# Patient Record
Sex: Female | Born: 1995 | Hispanic: No | Marital: Single | Smoking: Never smoker
Health system: Southern US, Community
[De-identification: ages and names within clinical notes are randomized; demographics above are authoritative.]

## PROBLEM LIST (undated history)

## (undated) DIAGNOSIS — K219 Gastro-esophageal reflux disease without esophagitis: Secondary | ICD-10-CM

## (undated) DIAGNOSIS — Z789 Other specified health status: Secondary | ICD-10-CM

---

## 2017-07-06 LAB — OB RESULTS CONSOLE HEPATITIS B SURFACE ANTIGEN: Hepatitis B Surface Ag: NEGATIVE

## 2017-07-06 LAB — OB RESULTS CONSOLE GC/CHLAMYDIA
Chlamydia: NEGATIVE
Gonorrhea: NEGATIVE

## 2017-07-06 LAB — OB RESULTS CONSOLE RUBELLA ANTIBODY, IGM: RUBELLA: IMMUNE

## 2017-07-06 LAB — OB RESULTS CONSOLE HIV ANTIBODY (ROUTINE TESTING): HIV: NONREACTIVE

## 2017-07-06 LAB — OB RESULTS CONSOLE VARICELLA ZOSTER ANTIBODY, IGG: Varicella: IMMUNE

## 2017-07-06 LAB — OB RESULTS CONSOLE RPR: RPR: NONREACTIVE

## 2017-09-14 ENCOUNTER — Other Ambulatory Visit: Payer: Self-pay | Admitting: Obstetrics and Gynecology

## 2017-09-14 DIAGNOSIS — Z3A24 24 weeks gestation of pregnancy: Secondary | ICD-10-CM

## 2017-09-14 DIAGNOSIS — Z3689 Encounter for other specified antenatal screening: Secondary | ICD-10-CM

## 2017-09-14 DIAGNOSIS — O283 Abnormal ultrasonic finding on antenatal screening of mother: Secondary | ICD-10-CM

## 2017-09-20 ENCOUNTER — Encounter (HOSPITAL_COMMUNITY): Payer: Self-pay | Admitting: *Deleted

## 2017-09-21 ENCOUNTER — Ambulatory Visit (HOSPITAL_COMMUNITY): Payer: Medicaid Other | Attending: Obstetrics & Gynecology

## 2017-09-21 ENCOUNTER — Ambulatory Visit (HOSPITAL_COMMUNITY): Admission: RE | Admit: 2017-09-21 | Payer: Medicaid Other | Source: Ambulatory Visit

## 2017-09-29 ENCOUNTER — Encounter (HOSPITAL_COMMUNITY): Payer: Self-pay

## 2017-10-03 ENCOUNTER — Other Ambulatory Visit: Payer: Self-pay | Admitting: Obstetrics and Gynecology

## 2017-10-03 ENCOUNTER — Ambulatory Visit (HOSPITAL_COMMUNITY)
Admission: RE | Admit: 2017-10-03 | Discharge: 2017-10-03 | Disposition: A | Discharge status: Home / Self Care | Payer: Medicaid Other | Source: Ambulatory Visit | Attending: Obstetrics and Gynecology | Admitting: Obstetrics and Gynecology

## 2017-10-03 ENCOUNTER — Encounter (HOSPITAL_COMMUNITY): Payer: Self-pay

## 2017-10-03 ENCOUNTER — Ambulatory Visit (HOSPITAL_COMMUNITY)
Admission: RE | Admit: 2017-10-03 | Discharge: 2017-10-03 | Disposition: A | Discharge status: Home / Self Care | Payer: Medicaid Other | Source: Ambulatory Visit | Attending: Nurse Practitioner | Admitting: Nurse Practitioner

## 2017-10-03 DIAGNOSIS — O359XX0 Maternal care for (suspected) fetal abnormality and damage, unspecified, not applicable or unspecified: Secondary | ICD-10-CM | POA: Insufficient documentation

## 2017-10-03 DIAGNOSIS — Z3A26 26 weeks gestation of pregnancy: Secondary | ICD-10-CM

## 2017-10-03 DIAGNOSIS — O283 Abnormal ultrasonic finding on antenatal screening of mother: Secondary | ICD-10-CM

## 2017-10-03 DIAGNOSIS — Z3689 Encounter for other specified antenatal screening: Secondary | ICD-10-CM | POA: Insufficient documentation

## 2017-10-03 DIAGNOSIS — Z6835 Body mass index (BMI) 35.0-35.9, adult: Secondary | ICD-10-CM | POA: Diagnosis not present

## 2017-10-03 DIAGNOSIS — E669 Obesity, unspecified: Secondary | ICD-10-CM | POA: Diagnosis not present

## 2017-10-03 DIAGNOSIS — O99212 Obesity complicating pregnancy, second trimester: Secondary | ICD-10-CM | POA: Insufficient documentation

## 2017-10-03 DIAGNOSIS — Z3A24 24 weeks gestation of pregnancy: Secondary | ICD-10-CM

## 2017-10-03 HISTORY — DX: Other specified health status: Z78.9

## 2017-10-05 DIAGNOSIS — O283 Abnormal ultrasonic finding on antenatal screening of mother: Secondary | ICD-10-CM | POA: Insufficient documentation

## 2017-10-05 DIAGNOSIS — Z3A26 26 weeks gestation of pregnancy: Secondary | ICD-10-CM | POA: Insufficient documentation

## 2017-10-05 NOTE — Progress Notes (Signed)
Genetic Counseling  High-Risk Gestation Note  Appointment Date:  10/03/2017 Referred By: Einar Gradyler, Janna, NP Date of Birth:  1996/07/01   Pregnancy History: G2P1001 Estimated Date of Delivery: 01/06/18 Estimated Gestational Age: 2556w3d Attending: Particia NearingMartha Decker, MD  Ms. Loma MessingJacqueline Bradway were seen for genetic counseling because of a soft marker for fetal aneuploidy on ultrasound. [employer] Spanish/English telephonic interpreter [phone] provided interpretation for today's visit.     In summary:  Reviewed EIF  Discussed age related risk for fetal aneuploidy  In the absence of additional risk factors, isolated EIF not expected to significantly increase the risk for Down syndrome in the pregnancy  Discussed significance of prior screening for fetal aneuploidy  Quad screen was attempted previously in pregnancy but was drawn too early  Currently too late in gestation for Quad screening  Offered additional screening  NIPS- declined  Discussed option of diagnostic testing  Amniocentesis- declined  Diane Carroll was seen for ultrasound today.  Ultrasound revealed an echogenic intracardiac focus (EIF). Remaining visualized fetal anatomy was within normal limits with no additional markers for aneuploidy.  The ultrasound report will be sent under separate cover.    We discussed that the second trimester genetic sonogram is targeted at identifying features associated with aneuploidy.  It has evolved as a screening tool used to provide an individualized risk assessment for Down syndrome and other trisomies.  The ability of sonography to aid in the detection of aneuploidies relies on identification of both major structural anomalies and "soft markers."  The patient was counseled that the latter term refers to findings that are often normal variants and do not cause any significant medical problems.  Nonetheless, these markers have a known association with aneuploidy.    The  patient was counseled that an EIF is characterized by calcified papillary muscle leading to a discreet dot in the left, or less commonly, the right ventricle.  We discussed that this finding is typically considered to be a benign variant; however, the risk of aneuploidy is increased when this marker is found in patients who have additional risk factors for fetal aneuploidy (AMA, abnormal screening test, or other markers or anomalies by fetal ultrasound).   We reviewed that Ms. Goldman's risk to have a fetus with Down syndrome is ~1 in 1000, based on her age of 21 years.  Given that no additional risk factors for fetal aneuploidy are present, an apparently isolated EIF would not be expected to significantly increase the risk for fetal aneuploidy.   We briefly reviewed chromosomes, nondisjunction, and the common features and variable prognosis of Down syndrome.  Ms. Mikle BosworthCarlos previously had Quad screening performed through her OB provider; however, ultrasound earlier in pregnancy [facility], thus, making the Quad screen drawn at too early a gestational age. She is currently too far advanced in gestation for repeat Quad screen.  We reviewed other available screening and diagnostic options including noninvasive prenatal screening (NIPS)/cell free DNA (cfDNA) screening and amniocentesis.  She was counseled regarding the benefits and limitations of each option.  We reviewed the approximate 1 in 300-500 risk for complications for amniocentesis.  After consideration of all the options, she declined additional screening or testing for fetal aneuploidy, including NIPS and amniocentesis.     Ms. Mikle BosworthCarlos was provided with written information regarding cystic fibrosis (CF), spinal muscular atrophy (SMA) and hemoglobinopathies including the carrier frequency, availability of carrier screening and prenatal diagnosis if indicated.  In addition, CF and hemoglobinopathies are routinely screened for as part  of the [place] newborn  screening panel, and SMA is available as part of newborn screening under a research protocol by enrolling in a program entitled Early Check.  She declined screening for CF, SMA and hemoglobinopathies.  The patient reported no known family history of birth defects, intellectual disability, and known genetic conditions for herself or the father of the pregnancy. Consanguinity was denied. Without further information regarding the provided family history, an accurate genetic risk cannot be calculated. Further genetic counseling is warranted if more information is obtained.  Ms. Mikle BosworthCarlos denied exposure to environmental toxins or chemical agents. She denied the use of alcohol, tobacco or street drugs. She denied significant viral illnesses during the course of her pregnancy.   I counseled Ms. Loma MessingJacqueline Edmonds regarding the above risks and available options.  The approximate face-to-face time with the genetic counselor was 20 minutes.     Quinn PlowmanKaren Aurelie Dicenzo, MS Certified Genetic Counselor 10/05/2017

## 2017-10-11 ENCOUNTER — Encounter (HOSPITAL_COMMUNITY): Payer: Self-pay

## 2017-10-11 ENCOUNTER — Other Ambulatory Visit (HOSPITAL_COMMUNITY): Payer: Self-pay

## 2017-11-27 ENCOUNTER — Inpatient Hospital Stay (HOSPITAL_COMMUNITY): Admission: AD | Admit: 2017-11-27 | Payer: Self-pay | Source: Ambulatory Visit | Admitting: Family Medicine

## 2017-11-28 NOTE — L&D Delivery Note (Signed)
Patient is 22 y.o. G2P1001 9952w5d admitted for IOl 2/2 polyhydraminos. S/p IOL with Pitocin. SROM at 0830.  Prenatal course also complicated by pyelonephritis .  Delivery Note At 1:18 PM a viable female was delivered via Vaginal, Spontaneous (Presentation: OA).  APGAR: 9, 9; weight pending.   Placenta status: Intact.  Cord: 3V with the following complications: None.  Cord pH: N/A  Anesthesia: Epidural   Episiotomy: None Lacerations: None Est. Blood Loss (mL): 350; Methergine IM x1 given for prophylaxis due to continued bleeding and polyhydramnios uterus.  Mom to postpartum.  Baby to Couplet care / Skin to Skin.  Caryl AdaJazma Hilde Churchman, DO OB Fellow [facility], [facility]

## 2017-12-01 ENCOUNTER — Encounter: Payer: Self-pay | Admitting: Obstetrics and Gynecology

## 2017-12-01 DIAGNOSIS — O34219 Maternal care for unspecified type scar from previous cesarean delivery: Secondary | ICD-10-CM | POA: Insufficient documentation

## 2017-12-01 DIAGNOSIS — Z789 Other specified health status: Secondary | ICD-10-CM | POA: Insufficient documentation

## 2017-12-15 LAB — OB RESULTS CONSOLE GBS: GBS: NEGATIVE

## 2017-12-31 ENCOUNTER — Inpatient Hospital Stay (HOSPITAL_COMMUNITY)
Admission: AD | Admit: 2017-12-31 | Discharge: 2018-01-06 | DRG: 806 | Disposition: A | Discharge status: Home / Self Care | Payer: Medicaid Other | Attending: Obstetrics & Gynecology | Admitting: Obstetrics & Gynecology

## 2017-12-31 ENCOUNTER — Other Ambulatory Visit: Payer: Self-pay

## 2017-12-31 ENCOUNTER — Encounter (HOSPITAL_COMMUNITY): Payer: Self-pay | Admitting: Emergency Medicine

## 2017-12-31 DIAGNOSIS — O9989 Other specified diseases and conditions complicating pregnancy, childbirth and the puerperium: Secondary | ICD-10-CM

## 2017-12-31 DIAGNOSIS — O403XX0 Polyhydramnios, third trimester, not applicable or unspecified: Secondary | ICD-10-CM | POA: Diagnosis present

## 2017-12-31 DIAGNOSIS — R509 Fever, unspecified: Secondary | ICD-10-CM

## 2017-12-31 DIAGNOSIS — O2300 Infections of kidney in pregnancy, unspecified trimester: Secondary | ICD-10-CM | POA: Diagnosis present

## 2017-12-31 DIAGNOSIS — O34219 Maternal care for unspecified type scar from previous cesarean delivery: Secondary | ICD-10-CM | POA: Diagnosis present

## 2017-12-31 DIAGNOSIS — N12 Tubulo-interstitial nephritis, not specified as acute or chronic: Secondary | ICD-10-CM | POA: Diagnosis present

## 2017-12-31 DIAGNOSIS — O409XX0 Polyhydramnios, unspecified trimester, not applicable or unspecified: Secondary | ICD-10-CM | POA: Diagnosis not present

## 2017-12-31 DIAGNOSIS — M549 Dorsalgia, unspecified: Secondary | ICD-10-CM

## 2017-12-31 DIAGNOSIS — Z3490 Encounter for supervision of normal pregnancy, unspecified, unspecified trimester: Secondary | ICD-10-CM

## 2017-12-31 DIAGNOSIS — O2303 Infections of kidney in pregnancy, third trimester: Secondary | ICD-10-CM | POA: Diagnosis present

## 2017-12-31 DIAGNOSIS — Z3A39 39 weeks gestation of pregnancy: Secondary | ICD-10-CM

## 2017-12-31 DIAGNOSIS — O283 Abnormal ultrasonic finding on antenatal screening of mother: Secondary | ICD-10-CM

## 2017-12-31 MED ORDER — ACETAMINOPHEN 325 MG PO TABS
650.0000 mg | ORAL_TABLET | Freq: Once | ORAL | Status: AC
  Administered 2017-12-31: 650 mg via ORAL
  Filled 2017-12-31: qty 2

## 2017-12-31 MED ORDER — ONDANSETRON HCL 4 MG/2ML IJ SOLN
4.0000 mg | Freq: Once | INTRAMUSCULAR | Status: AC
Start: 2017-12-31 — End: 2018-01-01
  Administered 2018-01-01: 4 mg via INTRAVENOUS
  Filled 2017-12-31: qty 2

## 2017-12-31 MED ORDER — SODIUM CHLORIDE 0.9 % IV BOLUS (SEPSIS)
1000.0000 mL | Freq: Once | INTRAVENOUS | Status: AC
  Administered 2018-01-01: 1000 mL via INTRAVENOUS

## 2017-12-31 NOTE — ED Triage Notes (Signed)
Patient presents with complaints of elevated temperature, chills and abdominal pain. Reports vomiting X3. Patient is 39 weeks, 2 days pregnant. Pain and fever unresolved with Tylenol. Last tylenol at 8pm today.

## 2017-12-31 NOTE — ED Notes (Signed)
Fetal heart rate 120 

## 2017-12-31 NOTE — ED Provider Notes (Signed)
[facility] Provider Note   CSN: [id number] Arrival date & time: 12/31/17  2215     History   Chief Complaint Chief Complaint  Patient presents with  . Fever    HPI Diane Carroll is a 22 y.o. female.  The history is provided by the patient. A language interpreter was used.  She is 39 weeks 2 days [facility]-G2P1001, and comes in with 4-day history of fever and chills.  Temperature is been as high as 103.  There has been minimal cough and no sore throat.  She has had some nausea and vomiting.  She has noted urinary urgency, frequency, tenesmus with mild dysuria.  There has been some suprapubic and some flank pain.  She has not noted any uterine contractions and she has not had any vaginal bleeding and has not had her water break.  She has received prenatal care and there have been no complications of the pregnancy.  She denies any sick contacts.  She did see her obstetrician 2 days ago and was told that fever is normal in pregnancy.  Past Medical History:  Diagnosis Date  . Medical history non-contributory     Patient Active Problem List   Diagnosis Date Noted  . History of cesarean delivery affecting pregnancy 12/01/2017  . Language barrier 12/01/2017  . Echogenic intracardiac focus of fetus on prenatal ultrasound 10/05/2017    Past Surgical History:  Procedure Laterality Date  . CESAREAN SECTION      OB History    Gravida Para Term Preterm AB Living   2 1 1     1    SAB TAB Ectopic Multiple Live Births                   Home Medications    Prior to Admission medications   Medication Sig Start Date End Date Taking? Authorizing Provider  Prenatal Vit-Fe Fumarate-FA (PRENATAL VITAMIN PO) Take by mouth.    [provider]    Family History History reviewed. No pertinent family history.  Social History Social History   Tobacco Use  . Smoking status: Never Smoker  . Smokeless tobacco:  Never Used  Substance Use Topics  . Alcohol use: No    Frequency: Never  . Drug use: No     Allergies   Patient has no known allergies.   Review of Systems Review of Systems  All other systems reviewed and are negative.    Physical Exam Updated Vital Signs BP 113/74 (BP Location: Right Arm)   Pulse (!) 136   Temp (!) 102.5 F (39.2 C) (Oral)   Resp 20   Ht 5\' 5"  (1.651 m)   Wt 88.5 kg (195 lb)   LMP 03/17/2017   SpO2 99%   BMI 32.45 kg/m   Physical Exam  Nursing note and vitals reviewed.  22 year old female, resting comfortably and in no acute distress. Vital signs are significant for fever and tachycardia. Oxygen saturation is 99%, which is normal. Head is normocephalic and atraumatic. PERRLA, EOMI. Oropharynx is clear. Neck is nontender and supple without adenopathy or JVD. Back is nontender in the midline.  There is moderate bilateral CVA tenderness. Lungs are clear without rales, wheezes, or rhonchi. Chest is nontender. Heart is tachycardic without murmur. Abdomen has a gravid term uterus with some mild tenderness diffusely and moderate tenderness in the suprapubic area.  There is no other abdominal tenderness and there are no other masses  or hepatosplenomegaly and peristalsis is normoactive. Extremities have no cyanosis or edema, full range of motion is present. Skin is warm and dry without rash. Neurologic: Mental status is normal, cranial nerves are intact, there are no motor or sensory deficits.  ED Treatments / Results  Labs (all labs ordered are listed, but only abnormal results are displayed) Labs Reviewed  COMPREHENSIVE METABOLIC PANEL - Abnormal; Notable for the following components:      Result Value   Sodium 130 (*)    Chloride 100 (*)    CO2 17 (*)    BUN <5 (*)    Calcium 8.7 (*)    Albumin 2.7 (*)    AST 55 (*)    Alkaline Phosphatase 290 (*)    Total Bilirubin 1.3 (*)    All other components within normal limits  CBC WITH  DIFFERENTIAL/PLATELET - Abnormal; Notable for the following components:   WBC 13.5 (*)    Neutro Abs 11.3 (*)    Monocytes Absolute 1.3 (*)    All other components within normal limits  URINALYSIS, ROUTINE W REFLEX MICROSCOPIC - Abnormal; Notable for the following components:   APPearance CLOUDY (*)    Specific Gravity, Urine 1.004 (*)    Hgb urine dipstick SMALL (*)    Ketones, ur 20 (*)    Nitrite POSITIVE (*)    Leukocytes, UA LARGE (*)    Bacteria, UA MANY (*)    Squamous Epithelial / LPF 0-5 (*)    All other components within normal limits  CULTURE, BLOOD (ROUTINE X 2)  CULTURE, BLOOD (ROUTINE X 2)  INFLUENZA PANEL BY PCR (TYPE A & B)  URINALYSIS, ROUTINE W REFLEX MICROSCOPIC  I-STAT CG4 LACTIC ACID, ED  I-STAT CG4 LACTIC ACID, ED  TYPE AND SCREEN   Procedures Procedures  CRITICAL CARE Performed by: Dione Booze Total critical care time: 45 minutes Critical care time was exclusive of separately billable procedures and treating other patients. Critical care was necessary to treat or prevent imminent or life-threatening deterioration. Critical care was time spent personally by me on the following activities: development of treatment plan with patient and/or surrogate as well as nursing, discussions with consultants, evaluation of patient's response to treatment, examination of patient, obtaining history from patient or surrogate, ordering and performing treatments and interventions, ordering and review of laboratory studies, ordering and review of radiographic studies, pulse oximetry and re-evaluation of patient's condition.  Medications Ordered in ED Medications  cefTRIAXone (ROCEPHIN) 1 g in dextrose 5 % 50 mL IVPB (not administered)  acetaminophen (TYLENOL) tablet 650 mg (650 mg Oral Given 12/31/17 2329)  sodium chloride 0.9 % bolus 1,000 mL (1,000 mLs Intravenous New Bag/Given 01/01/18 0025)  ondansetron (ZOFRAN) injection 4 mg (4 mg Intravenous Given 01/01/18 0025)      Initial Impression / Assessment and Plan / ED Course  I have reviewed the triage vital signs and the nursing notes.  Pertinent labs & imaging results that were available during my care of the patient were reviewed by me and considered in my medical decision making (see chart for details).  Term pregnancy with fever and urinary symptoms worsen for urinary tract infection and pyelonephritis.  Patient does not appear seriously ill, but sepsis evaluation is initiated and fluids.  Obstetrical rapid response evaluation is requested and fetal monitoring will be done.  She has no prior records in the [facility] system.  Obstetrical rapid response nurse has seen the patient and notes cervix is dilated to 1 cm.  She discussed the case with Dr. Debroah Loop who is requested that she be transferred to [facility].  At this point, she has not given a urine sample, but she is started on empiric antibiotics for urinary tract infection-ceftriaxone.  Urinalysis has indeed come back positive for infection with positive nitrite, large leukocytes, and many bacteria.  This result came back after she had already been transferred to [facility].  Appropriate treatment had already been initiated.  Final Clinical Impressions(s) / ED Diagnoses   Final diagnoses:  Fever, unspecified fever cause  Term pregnancy    ED Discharge Orders    None       Dione Booze, MD 01/01/18 [phone]

## 2018-01-01 ENCOUNTER — Other Ambulatory Visit: Payer: Self-pay

## 2018-01-01 ENCOUNTER — Inpatient Hospital Stay (HOSPITAL_COMMUNITY): Payer: Medicaid Other

## 2018-01-01 ENCOUNTER — Encounter (HOSPITAL_COMMUNITY): Payer: Self-pay | Admitting: *Deleted

## 2018-01-01 DIAGNOSIS — O2303 Infections of kidney in pregnancy, third trimester: Secondary | ICD-10-CM | POA: Diagnosis not present

## 2018-01-01 DIAGNOSIS — R509 Fever, unspecified: Secondary | ICD-10-CM | POA: Diagnosis present

## 2018-01-01 DIAGNOSIS — O26893 Other specified pregnancy related conditions, third trimester: Secondary | ICD-10-CM | POA: Diagnosis not present

## 2018-01-01 DIAGNOSIS — Z3A39 39 weeks gestation of pregnancy: Secondary | ICD-10-CM

## 2018-01-01 DIAGNOSIS — O2300 Infections of kidney in pregnancy, unspecified trimester: Secondary | ICD-10-CM | POA: Diagnosis present

## 2018-01-01 DIAGNOSIS — O403XX0 Polyhydramnios, third trimester, not applicable or unspecified: Secondary | ICD-10-CM | POA: Diagnosis present

## 2018-01-01 DIAGNOSIS — N12 Tubulo-interstitial nephritis, not specified as acute or chronic: Secondary | ICD-10-CM | POA: Diagnosis present

## 2018-01-01 DIAGNOSIS — O34219 Maternal care for unspecified type scar from previous cesarean delivery: Secondary | ICD-10-CM | POA: Diagnosis present

## 2018-01-01 LAB — CBC WITH DIFFERENTIAL/PLATELET
BASOS ABS: 0 10*3/uL (ref 0.0–0.1)
BASOS ABS: 0 10*3/uL (ref 0.0–0.1)
Basophils Relative: 0 %
Basophils Relative: 0 %
EOS ABS: 0 10*3/uL (ref 0.0–0.7)
EOS PCT: 0 %
Eosinophils Absolute: 0 10*3/uL (ref 0.0–0.7)
Eosinophils Relative: 0 %
HCT: 38.6 % (ref 36.0–46.0)
HCT: 32 % — ABNORMAL LOW (ref 36.0–46.0)
HEMOGLOBIN: 12.6 g/dL (ref 12.0–15.0)
Hemoglobin: 10.8 g/dL — ABNORMAL LOW (ref 12.0–15.0)
LYMPHS ABS: 1.1 10*3/uL (ref 0.7–4.0)
LYMPHS PCT: 8 %
Lymphocytes Relative: 7 %
Lymphs Abs: 0.9 10*3/uL (ref 0.7–4.0)
MCH: 27.6 pg (ref 26.0–34.0)
MCH: 28.3 pg (ref 26.0–34.0)
MCHC: 32.6 g/dL (ref 30.0–36.0)
MCHC: 33.8 g/dL (ref 30.0–36.0)
MCV: 84.6 fL (ref 78.0–100.0)
MCV: 83.8 fL (ref 78.0–100.0)
MONO ABS: 1.1 10*3/uL — AB (ref 0.1–1.0)
Monocytes Absolute: 1.3 10*3/uL — ABNORMAL HIGH (ref 0.1–1.0)
Monocytes Relative: 10 %
Monocytes Relative: 7 %
NEUTROS PCT: 83 %
Neutro Abs: 11.3 10*3/uL — ABNORMAL HIGH (ref 1.7–7.7)
Neutro Abs: 12.5 10*3/uL — ABNORMAL HIGH (ref 1.7–7.7)
Neutrophils Relative %: 85 %
Platelets: 268 10*3/uL (ref 150–400)
Platelets: 299 10*3/uL (ref 150–400)
RBC: 3.82 MIL/uL — AB (ref 3.87–5.11)
RBC: 4.56 MIL/uL (ref 3.87–5.11)
RDW: 14.8 % (ref 11.5–15.5)
RDW: 14.5 % (ref 11.5–15.5)
WBC: 13.5 10*3/uL — AB (ref 4.0–10.5)
WBC: 14.7 10*3/uL — AB (ref 4.0–10.5)

## 2018-01-01 LAB — URINALYSIS, ROUTINE W REFLEX MICROSCOPIC
Bilirubin Urine: NEGATIVE
GLUCOSE, UA: NEGATIVE mg/dL
KETONES UR: 20 mg/dL — AB
NITRITE: POSITIVE — AB
PROTEIN: NEGATIVE mg/dL
Specific Gravity, Urine: 1.004 — ABNORMAL LOW (ref 1.005–1.030)
pH: 6 (ref 5.0–8.0)

## 2018-01-01 LAB — COMPREHENSIVE METABOLIC PANEL
ALT: 52 U/L (ref 14–54)
AST: 55 U/L — ABNORMAL HIGH (ref 15–41)
Albumin: 2.7 g/dL — ABNORMAL LOW (ref 3.5–5.0)
Alkaline Phosphatase: 290 U/L — ABNORMAL HIGH (ref 38–126)
Anion gap: 13 (ref 5–15)
BILIRUBIN TOTAL: 1.3 mg/dL — AB (ref 0.3–1.2)
BUN: <5 mg/dL — ABNORMAL LOW (ref 6–20)
CALCIUM: 8.7 mg/dL — AB (ref 8.9–10.3)
CHLORIDE: 100 mmol/L — AB (ref 101–111)
CO2: 17 mmol/L — ABNORMAL LOW (ref 22–32)
CREATININE: 0.69 mg/dL (ref 0.44–1.00)
Glucose, Bld: 86 mg/dL (ref 65–99)
Potassium: 3.9 mmol/L (ref 3.5–5.1)
Sodium: 130 mmol/L — ABNORMAL LOW (ref 135–145)
TOTAL PROTEIN: 6.8 g/dL (ref 6.5–8.1)

## 2018-01-01 LAB — TYPE AND SCREEN
ABO/RH(D): B POS
Antibody Screen: NEGATIVE

## 2018-01-01 LAB — INFLUENZA PANEL BY PCR (TYPE A & B)
INFLAPCR: NEGATIVE
Influenza B By PCR: NEGATIVE

## 2018-01-01 LAB — I-STAT CG4 LACTIC ACID, ED: LACTIC ACID, VENOUS: 1.28 mmol/L (ref 0.5–1.9)

## 2018-01-01 LAB — ABO/RH: ABO/RH(D): B POS

## 2018-01-01 MED ORDER — ACETAMINOPHEN 325 MG PO TABS
650.0000 mg | ORAL_TABLET | ORAL | Status: DC | PRN
  Filled 2018-01-01: qty 2

## 2018-01-01 MED ORDER — OXYCODONE-ACETAMINOPHEN 5-325 MG PO TABS
2.0000 | ORAL_TABLET | ORAL | Status: DC | PRN
  Administered 2018-01-01 – 2018-01-02 (×4): 2 via ORAL
  Filled 2018-01-01 (×4): qty 2

## 2018-01-01 MED ORDER — LACTATED RINGERS IV SOLN
INTRAVENOUS | Status: DC
  Administered 2018-01-01: 02:00:00 via INTRAVENOUS

## 2018-01-01 MED ORDER — DEXTROSE 5 % IV SOLN
1.0000 g | Freq: Once | INTRAVENOUS | Status: AC
  Administered 2018-01-01: 1 g via INTRAVENOUS
  Filled 2018-01-01: qty 10

## 2018-01-01 MED ORDER — PRENATAL MULTIVITAMIN CH
1.0000 | ORAL_TABLET | Freq: Every day | ORAL | Status: DC
  Administered 2018-01-01 – 2018-01-03 (×3): 1 via ORAL
  Filled 2018-01-01 (×4): qty 1

## 2018-01-01 MED ORDER — HYDROMORPHONE HCL 1 MG/ML IJ SOLN
1.0000 mg | Freq: Once | INTRAMUSCULAR | Status: AC
Start: 2018-01-01 — End: 2018-01-01
  Administered 2018-01-01: 1 mg via INTRAVENOUS
  Filled 2018-01-01: qty 1

## 2018-01-01 MED ORDER — DEXTROSE 5 % IV SOLN
2.0000 g | INTRAVENOUS | Status: DC
  Administered 2018-01-01 – 2018-01-02 (×2): 2 g via INTRAVENOUS
  Filled 2018-01-01 (×3): qty 2

## 2018-01-01 MED ORDER — DOCUSATE SODIUM 100 MG PO CAPS
100.0000 mg | ORAL_CAPSULE | Freq: Every day | ORAL | Status: DC
  Administered 2018-01-01 – 2018-01-03 (×3): 100 mg via ORAL
  Filled 2018-01-01 (×4): qty 1

## 2018-01-01 MED ORDER — CALCIUM CARBONATE ANTACID 500 MG PO CHEW: 2.0000 | CHEWABLE_TABLET | ORAL | Status: DC | PRN

## 2018-01-01 MED ORDER — ZOLPIDEM TARTRATE 5 MG PO TABS: 5.0000 mg | ORAL_TABLET | Freq: Every evening | ORAL | Status: DC | PRN

## 2018-01-01 MED ORDER — ACETAMINOPHEN 325 MG PO TABS
325.0000 mg | ORAL_TABLET | Freq: Once | ORAL | Status: AC
  Administered 2018-01-02: 325 mg via ORAL
  Filled 2018-01-01: qty 1

## 2018-01-01 MED ORDER — SODIUM CHLORIDE 0.9 % IV SOLN
INTRAVENOUS | Status: DC
  Administered 2018-01-01: 125 mL/h via INTRAVENOUS

## 2018-01-01 NOTE — Progress Notes (Signed)
Dr Debroah LoopArnold called and notified of patient complaints and arrival; made aware of patient temp, HR, S/S, FHR, and contraction pattern orders given [facility] at this time

## 2018-01-01 NOTE — MAU Provider Note (Signed)
Chief Complaint:  Fever   First Provider Initiated Contact with Patient 01/01/18 [phone]      HPI: Diane Carroll is a 22 y.o. G2P1001 at [redacted]w[redacted]d pt of [facility] with OB hx significant for C/S x 1 at term who presents to maternity admissions sent from [facility] with fever x 3 days, constant low back pain, and intermittent left lower abdominal pain. Her fever has been as high as 103 starting 3 days ago.  She reports back pain is low in her back on the left side, constant dull/burning pain that started 3 days ago and is worsening and severe today. It radiates to her left flank and left lower abdomen.  It is associated with nausea and vomiting x 1-2 in 3 days.  She reports some dysuria when other symptoms started but less now.  She reports that the pain makes her short of breath because it hurts to breathe deeply. She reports a rare cough but denies congestion, sore throat, or rhinorrhea.  There are no other associated symptoms. She has not tried any treatments.  She reports good fetal movement, denies LOF, vaginal bleeding, vaginal itching/burning, urinary symptoms, h/a, dizziness, n/v, or fever/chills.    Video interpreter for spanish language used for all communication.  HPI  Past Medical History: Past Medical History:  Diagnosis Date  . Medical history non-contributory     Past obstetric history: OB History  Gravida Para Term Preterm AB Living  2 1 1     1   SAB TAB Ectopic Multiple Live Births          1    # Outcome Date GA Lbr Len/2nd Weight Sex Delivery Anes PTL Lv  2 Current           1 Term      CS-Unspec         Past Surgical History: Past Surgical History:  Procedure Laterality Date  . CESAREAN SECTION      Family History: History reviewed. No pertinent family history.  Social History: Social History   Tobacco Use  . Smoking status: Never Smoker  . Smokeless tobacco: Never Used  Substance Use Topics  . Alcohol use: No    Frequency: Never  . Drug use: No    Allergies:  No Known Allergies  Meds:  Medications Prior to Admission  Medication Sig Dispense Refill Last Dose  . Prenatal Vit-Fe Fumarate-FA (PRENATAL VITAMIN PO) Take by mouth.   Taking    ROS:  Review of Systems  Constitutional: Positive for chills. Negative for fatigue and fever.  Eyes: Negative for visual disturbance.  Respiratory: Negative for shortness of breath.   Cardiovascular: Negative for chest pain.  Gastrointestinal: Positive for abdominal pain. Negative for nausea and vomiting.  Genitourinary: Positive for pelvic pain. Negative for difficulty urinating, dysuria, flank pain, vaginal bleeding, vaginal discharge and vaginal pain.  Musculoskeletal: Positive for back pain.  Neurological: Negative for dizziness and headaches.  Psychiatric/Behavioral: Negative.      I have reviewed patient's Past Medical Hx, Surgical Hx, Family Hx, Social Hx, medications and allergies.   Physical Exam   Patient Vitals for the past 24 hrs:  BP Temp Temp src Pulse Resp SpO2 Height Weight  01/01/18 0427 110/76 - - (!) 120 20 97 % - -  01/01/18 0203 115/65 99 F (37.2 C) Oral (!) 114 (!) 24 98 % - -  01/01/18 0130 101/65 - - (!) 129 - 97 % - -  01/01/18 0129 101/65 100.2 F (37.9 C) - (!)  129 (!) 22 96 % - -  01/01/18 0100 105/71 - - (!) 120 - 97 % - -  01/01/18 0057 - - - (!) 117 - 97 % - -  01/01/18 0056 - - - (!) 115 - 97 % - -  01/01/18 0050 109/70 - - (!) 122 - 96 % - -  01/01/18 0030 114/81 - - (!) 120 - 98 % - -  01/01/18 0000 120/81 - - (!) 131 - 98 % - -  12/31/17 2330 115/86 - - (!) 126 - 99 % - -  12/31/17 2328 113/74 (!) 102.5 F (39.2 C) Oral (!) 136 20 99 % - -  12/31/17 2255 - - - - - - 5\' 5"  (1.651 m) 195 lb (88.5 kg)  12/31/17 2239 124/78 (!) 101.6 F (38.7 C) Oral (!) 124 18 100 % - -   Constitutional: Well-developed, well-nourished female in moderate distress.  HEART: mild tachycardia with normal heart sounds, regular rhythm RESP: normal effort, lung sounds clear and equal  bilaterally GI: Abd soft, non-tender, gravid appropriate for gestational age.  MS: Extremities nontender, no edema, normal ROM Neurologic: Alert and oriented x 4.  GU: Neg CVAT bilaterally but pain to palpation of left lower back   Dilation: Fingertip Effacement (%): 50 Cervical Position: Posterior Exam by:: Imo Cumbie, CNM  FHT:  Baseline 150 , moderate variability, accelerations present, no decelerations Contractions: q 2-10 mins, mild to palpation   Labs: Results for orders placed or performed during the hospital encounter of 12/31/17 (from the past 24 hour(s))  Influenza panel by PCR (type A & B)     Status: None   Collection Time: 12/31/17 10:58 PM  Result Value Ref Range   Influenza A By PCR NEGATIVE NEGATIVE   Influenza B By PCR NEGATIVE NEGATIVE  Comprehensive metabolic panel     Status: Abnormal   Collection Time: 01/01/18 12:21 AM  Result Value Ref Range   Sodium 130 (L) 135 - 145 mmol/L   Potassium 3.9 3.5 - 5.1 mmol/L   Chloride 100 (L) 101 - 111 mmol/L   CO2 17 (L) 22 - 32 mmol/L   Glucose, Bld 86 65 - 99 mg/dL   BUN <5 (L) 6 - 20 mg/dL   Creatinine, Ser 1.61 0.44 - 1.00 mg/dL   Calcium 8.7 (L) 8.9 - 10.3 mg/dL   Total Protein 6.8 6.5 - 8.1 g/dL   Albumin 2.7 (L) 3.5 - 5.0 g/dL   AST 55 (H) 15 - 41 U/L   ALT 52 14 - 54 U/L   Alkaline Phosphatase 290 (H) 38 - 126 U/L   Total Bilirubin 1.3 (H) 0.3 - 1.2 mg/dL   GFR calc non Af Amer >60 >60 mL/min   GFR calc Af Amer >60 >60 mL/min   Anion gap 13 5 - 15  CBC WITH DIFFERENTIAL     Status: Abnormal   Collection Time: 01/01/18 12:21 AM  Result Value Ref Range   WBC 13.5 (H) 4.0 - 10.5 K/uL   RBC 4.56 3.87 - 5.11 MIL/uL   Hemoglobin 12.6 12.0 - 15.0 g/dL   HCT 09.6 04.5 - 40.9 %   MCV 84.6 78.0 - 100.0 fL   MCH 27.6 26.0 - 34.0 pg   MCHC 32.6 30.0 - 36.0 g/dL   RDW 81.1 91.4 - 78.2 %   Platelets 268 150 - 400 K/uL   Neutrophils Relative % 83 %   Neutro Abs 11.3 (H) 1.7 - 7.7 K/uL   Lymphocytes  Relative 7 %    Lymphs Abs 0.9 0.7 - 4.0 K/uL   Monocytes Relative 10 %   Monocytes Absolute 1.3 (H) 0.1 - 1.0 K/uL   Eosinophils Relative 0 %   Eosinophils Absolute 0.0 0.0 - 0.7 K/uL   Basophils Relative 0 %   Basophils Absolute 0.0 0.0 - 0.1 K/uL  I-Stat CG4 Lactic Acid, ED  ([facility])     Status: None   Collection Time: 01/01/18 12:45 AM  Result Value Ref Range   Lactic Acid, Venous 1.28 0.5 - 1.9 mmol/L  Urinalysis, Routine w reflex microscopic     Status: Abnormal   Collection Time: 01/01/18  2:30 AM  Result Value Ref Range   Color, Urine YELLOW YELLOW   APPearance CLOUDY (A) CLEAR   Specific Gravity, Urine 1.004 (L) 1.005 - 1.030   pH 6.0 5.0 - 8.0   Glucose, UA NEGATIVE NEGATIVE mg/dL   Hgb urine dipstick SMALL (A) NEGATIVE   Bilirubin Urine NEGATIVE NEGATIVE   Ketones, ur 20 (A) NEGATIVE mg/dL   Protein, ur NEGATIVE NEGATIVE mg/dL   Nitrite POSITIVE (A) NEGATIVE   Leukocytes, UA LARGE (A) NEGATIVE   RBC / HPF 0-5 0 - 5 RBC/hpf   WBC, UA TOO NUMEROUS TO COUNT 0 - 5 WBC/hpf   Bacteria, UA MANY (A) NONE SEEN   Squamous Epithelial / LPF 0-5 (A) NONE SEEN   Mucus PRESENT       Imaging:  [place] Renal  Result Date: 01/01/2018 CLINICAL DATA:  Pyelonephritis.  Late third trimester pregnancy. EXAM: RENAL / URINARY TRACT ULTRASOUND COMPLETE COMPARISON:  None. FINDINGS: Right Kidney: Length: 14 cm.  Moderate hydronephrosis.  No renal mass. Left Kidney: Length: 12.9 cm.  Moderate hydronephrosis.  No renal mass. Bladder: Appears normal for degree of bladder distention. IMPRESSION: Moderate bilateral hydronephrosis. Electronically Signed   By: Awilda Metroourtnay  Bloomer M.D.   On: 01/01/2018 04:11    MAU Course/MDM: Reviewed labs from ED, with CBC, CMP, lactic acid.  CBC with WBCs 13.5 wnl for pregnancy and lactic acid wnl.  Blood cultures x 2 pending.   NST reviewed and reactive Pt presents with significant back pain and diaphoretic with low grade temp after medications/IV fluids given in ED Flu swab  negative UA with positive nitrites and large leukocytes, c/w UTI Fever likely from pyelonephritis, no other source of fever found Cervix FT/50/-3, unchanged in several hours so no evidence of labor despite irregular mild contractions Renal US with moderate hydronephrosis bilaterally Consult Dr Debroah LoopArnold with presentation, exam findings and test results.  Admit to [facility] Unit IV fluids, Rocephin 1 g IV now since 1 g given in ED, then 2 g Q 24 hours Percocet PRN pain   Assessment: 1. Pyelonephritis affecting pregnancy in third trimester   2. Fever, unspecified fever cause   3. Term pregnancy   4. Back pain affecting pregnancy in third trimester   5. Previous cesarean delivery affecting pregnancy     Plan: Admit to [facility] Unit Abx, IV fluids, pain management Repeat CBC today   Sharen CounterLisa Leftwich-Kirby Certified Nurse-Midwife 01/01/2018 4:29 AM

## 2018-01-01 NOTE — MAU Note (Signed)
[facility].

## 2018-01-01 NOTE — Progress Notes (Signed)
RROB called to patient's BS [facility] with complaints of abdominal pain, back pain, nausea, SOB, cough, fever, and chills for 4 days; patient is a G2P1 who is 39 and 2/[redacted] weeks along in her pregnancy at this time; EFM applied and assessing at this time; SVE performed and 1cm/thick noted

## 2018-01-01 NOTE — ED Notes (Signed)
FHR 171  

## 2018-01-01 NOTE — H&P (Signed)
Expand All Collapse All       [] Hide copied text  [] Hover for details   Chief Complaint:  Fever   First Provider Initiated Contact with Patient 01/01/18 0313      HPI: Diane Carroll is a 22 y.o. G2P1001 at 5533w2d pt of [facility] with OB hx significant for C/S x 1 at term who [facility] with fever x 3 days, constant low back pain, and intermittent left lower abdominal pain. Her fever has been as high as 103 starting 3 days ago.  She reports back pain is low in her back on the left side, constant dull/burning pain that started 3 days ago and is worsening and severe today. It radiates to her left flank and left lower abdomen.  It is associated with nausea and vomiting x 1-2 in 3 days.  She reports some dysuria when other symptoms started but less now.  She reports that the pain makes her short of breath because it hurts to breathe deeply. She reports a rare cough but denies congestion, sore throat, or rhinorrhea.  There are no other associated symptoms. She has not tried any treatments.  She reports good fetal movement, denies LOF, vaginal bleeding, vaginal itching/burning, urinary symptoms, h/a, dizziness, n/v, or fever/chills.    Video interpreter for spanish language used for all communication.  HPI  Past Medical History:     Past Medical History:  Diagnosis Date  . Medical history non-contributory     Past obstetric history:                 OB History  Gravida Para Term Preterm AB Living  2 1 1     1   SAB TAB Ectopic Multiple Live Births          1    # Outcome Date GA Lbr Len/2nd Weight Sex Delivery Anes PTL Lv  2 Current           1 Term      CS-Unspec         Past Surgical History:      Past Surgical History:  Procedure Laterality Date  . CESAREAN SECTION      Family History: History reviewed. No pertinent family history.  Social History: Social History   Tobacco Use  . Smoking status:  Never Smoker  . Smokeless tobacco: Never Used  Substance Use Topics  . Alcohol use: No    Frequency: Never  . Drug use: No    Allergies: No Known Allergies  Meds:         Medications Prior to Admission  Medication Sig Dispense Refill Last Dose  . Prenatal Vit-Fe Fumarate-FA (PRENATAL VITAMIN PO) Take by mouth.   Taking    ROS:  Review of Systems  Constitutional: Positive for chills. Negative for fatigue and fever.  Eyes: Negative for visual disturbance.  Respiratory: Negative for shortness of breath.   Cardiovascular: Negative for chest pain.  Gastrointestinal: Positive for abdominal pain. Negative for nausea and vomiting.  Genitourinary: Positive for pelvic pain. Negative for difficulty urinating, dysuria, flank pain, vaginal bleeding, vaginal discharge and vaginal pain.  Musculoskeletal: Positive for back pain.  Neurological: Negative for dizziness and headaches.  Psychiatric/Behavioral: Negative.      I have reviewed patient's Past Medical Hx, Surgical Hx, Family Hx, Social Hx, medications and allergies.   Physical Exam   Patient Vitals for the past 24 hrs:  BP Temp Temp src Pulse Resp SpO2 Height Weight  01/01/18 0427 110/76 - - (!) 120 20 97 % - -  01/01/18 0203 115/65 99 F (37.2 C) Oral (!) 114 (!) 24 98 % - -  01/01/18 0130 101/65 - - (!) 129 - 97 % - -  01/01/18 0129 101/65 100.2 F (37.9 C) - (!) 129 (!) 22 96 % - -  01/01/18 0100 105/71 - - (!) 120 - 97 % - -  01/01/18 0057 - - - (!) 117 - 97 % - -  01/01/18 0056 - - - (!) 115 - 97 % - -  01/01/18 0050 109/70 - - (!) 122 - 96 % - -  01/01/18 0030 114/81 - - (!) 120 - 98 % - -  01/01/18 0000 120/81 - - (!) 131 - 98 % - -  12/31/17 2330 115/86 - - (!) 126 - 99 % - -  12/31/17 2328 113/74 (!) 102.5 F (39.2 C) Oral (!) 136 20 99 % - -  12/31/17 2255 - - - - - - 5\' 5"  (1.651 m) 195 lb (88.5 kg)  12/31/17 2239 124/78 (!) 101.6 F (38.7 C) Oral (!) 124 18 100 % - -   Constitutional:  Well-developed, well-nourished female in moderate distress.  HEART: mild tachycardia with normal heart sounds, regular rhythm RESP: normal effort, lung sounds clear and equal bilaterally GI: Abd soft, non-tender, gravid appropriate for gestational age.  MS: Extremities nontender, no edema, normal ROM Neurologic: Alert and oriented x 4.  GU: Neg CVAT bilaterally but pain to palpation of left lower back   Dilation: Fingertip Effacement (%): 50 Cervical Position: Posterior Exam by:: LISA, CNM  FHT:  Baseline 150 , moderate variability, accelerations present, no decelerations Contractions: q 2-10 mins, mild to palpation   Labs: LabResultsLast24Hours  Results for orders placed or performed during the hospital encounter of 12/31/17 (from the past 24 hour(s))  Influenza panel by PCR (type A & B)     Status: None   Collection Time: 12/31/17 10:58 PM  Result Value Ref Range   Influenza A By PCR NEGATIVE NEGATIVE   Influenza B By PCR NEGATIVE NEGATIVE  Comprehensive metabolic panel     Status: Abnormal   Collection Time: 01/01/18 12:21 AM  Result Value Ref Range   Sodium 130 (L) 135 - 145 mmol/L   Potassium 3.9 3.5 - 5.1 mmol/L   Chloride 100 (L) 101 - 111 mmol/L   CO2 17 (L) 22 - 32 mmol/L   Glucose, Bld 86 65 - 99 mg/dL   BUN <5 (L) 6 - 20 mg/dL   Creatinine, Ser 0.86 0.44 - 1.00 mg/dL   Calcium 8.7 (L) 8.9 - 10.3 mg/dL   Total Protein 6.8 6.5 - 8.1 g/dL   Albumin 2.7 (L) 3.5 - 5.0 g/dL   AST 55 (H) 15 - 41 U/L   ALT 52 14 - 54 U/L   Alkaline Phosphatase 290 (H) 38 - 126 U/L   Total Bilirubin 1.3 (H) 0.3 - 1.2 mg/dL   GFR calc non Af Amer >60 >60 mL/min   GFR calc Af Amer >60 >60 mL/min   Anion gap 13 5 - 15  CBC WITH DIFFERENTIAL     Status: Abnormal   Collection Time: 01/01/18 12:21 AM  Result Value Ref Range   WBC 13.5 (H) 4.0 - 10.5 K/uL   RBC 4.56 3.87 - 5.11 MIL/uL   Hemoglobin 12.6 12.0 - 15.0 g/dL   HCT 57.8 46.9 - 62.9 %   MCV  84.6 78.0 -  100.0 fL   MCH 27.6 26.0 - 34.0 pg   MCHC 32.6 30.0 - 36.0 g/dL   RDW 16.1 09.6 - 04.5 %   Platelets 268 150 - 400 K/uL   Neutrophils Relative % 83 %   Neutro Abs 11.3 (H) 1.7 - 7.7 K/uL   Lymphocytes Relative 7 %   Lymphs Abs 0.9 0.7 - 4.0 K/uL   Monocytes Relative 10 %   Monocytes Absolute 1.3 (H) 0.1 - 1.0 K/uL   Eosinophils Relative 0 %   Eosinophils Absolute 0.0 0.0 - 0.7 K/uL   Basophils Relative 0 %   Basophils Absolute 0.0 0.0 - 0.1 K/uL  I-Stat CG4 Lactic Acid, ED  (not at  [facility])     Status: None   Collection Time: 01/01/18 12:45 AM  Result Value Ref Range   Lactic Acid, Venous 1.28 0.5 - 1.9 mmol/L  Urinalysis, Routine w reflex microscopic     Status: Abnormal   Collection Time: 01/01/18  2:30 AM  Result Value Ref Range   Color, Urine YELLOW YELLOW   APPearance CLOUDY (A) CLEAR   Specific Gravity, Urine 1.004 (L) 1.005 - 1.030   pH 6.0 5.0 - 8.0   Glucose, UA NEGATIVE NEGATIVE mg/dL   Hgb urine dipstick SMALL (A) NEGATIVE   Bilirubin Urine NEGATIVE NEGATIVE   Ketones, ur 20 (A) NEGATIVE mg/dL   Protein, ur NEGATIVE NEGATIVE mg/dL   Nitrite POSITIVE (A) NEGATIVE   Leukocytes, UA LARGE (A) NEGATIVE   RBC / HPF 0-5 0 - 5 RBC/hpf   WBC, UA TOO NUMEROUS TO COUNT 0 - 5 WBC/hpf   Bacteria, UA MANY (A) NONE SEEN   Squamous Epithelial / LPF 0-5 (A) NONE SEEN   Mucus PRESENT       Imaging:   ImagingResults  US Renal  Result Date: 01/01/2018 CLINICAL DATA:  Pyelonephritis.  Late third trimester pregnancy. EXAM: RENAL / URINARY TRACT ULTRASOUND COMPLETE COMPARISON:  None. FINDINGS: Right Kidney: Length: 14 cm.  Moderate hydronephrosis.  No renal mass. Left Kidney: Length: 12.9 cm.  Moderate hydronephrosis.  No renal mass. Bladder: Appears normal for degree of bladder distention. IMPRESSION: Moderate bilateral hydronephrosis. Electronically Signed   By: Awilda Metro M.D.   On: 01/01/2018 04:11     MAU  Course/MDM: Reviewed labs from ED, with CBC, CMP, lactic acid.  CBC with WBCs 13.5 wnl for pregnancy and lactic acid wnl.  Blood cultures x 2 pending.   NST reviewed and reactive Pt presents with significant back pain and diaphoretic with low grade temp after medications/IV fluids given in ED Flu swab negative UA with positive nitrites and large leukocytes, c/w UTI Fever likely from pyelonephritis, no other source of fever found Cervix FT/50/-3, unchanged in several hours so no evidence of labor despite irregular mild contractions Renal US with moderate hydronephrosis bilaterally Consult Dr Debroah Loop with presentation, exam findings and test results.  Admit to [facility] Unit IV fluids, Rocephin 1 g IV now since 1 g given in ED, then 2 g Q 24 hours Percocet PRN pain   Assessment: 1. Pyelonephritis affecting pregnancy in third trimester   2. Fever, unspecified fever cause   3. Term pregnancy   4. Back pain affecting pregnancy in third trimester   5. Previous cesarean delivery affecting pregnancy     Plan: Admit to [facility] Unit Abx, IV fluids, pain management Repeat CBC today   Sharen Counter Certified Nurse-Midwife 01/01/2018     Attestation of Attending Supervision of Advanced Practitioner (CNM/NP/PA): Evaluation  and management procedures were performed by the Advanced Practitioner under my supervision and collaboration. I have reviewed the Advanced Practitioner's note and chart, and I agree with the management and plan.  Scheryl Darter MD

## 2018-01-01 NOTE — Progress Notes (Signed)
[facility] called for transport 

## 2018-01-01 NOTE — Progress Notes (Signed)
Report called to Tia AlertPaige Grady, RN in MAU at this time

## 2018-01-02 DIAGNOSIS — O26893 Other specified pregnancy related conditions, third trimester: Secondary | ICD-10-CM

## 2018-01-02 NOTE — Progress Notes (Signed)
FACULTY PRACTICE ANTEPARTUM PROGRESS NOTE  Diane Carroll is a 22 y.o. G2P1001 at 7220w3d who is admitted for presumed pyelonephritis.  Estimated Date of Delivery: 01/06/18  Length of Stay:  1 Days. Admitted 12/31/2017  Subjective:  Patient reports normal fetal movement.  She reports occasional uterine contractions, denies bleeding and leaking of fluid per vagina.   Vitals:  Blood pressure 102/70, pulse 99, temperature 98.1 F (36.7 C), temperature source Oral, resp. rate 20, height 5\' 5"  (1.651 m), weight 204 lb (92.5 kg), last menstrual period 03/17/2017, SpO2 99 %. Physical Examination: CONSTITUTIONAL: Well-developed, well-nourished female in no acute distress.  HENT:  Normocephalic, atraumatic, External right and left ear normal. Oropharynx is clear and moist EYES: Conjunctivae and EOM are normal. Pupils are equal, round, and reactive to light. No scleral icterus.  NECK: Normal range of motion, supple, no masses. SKIN: Skin is warm and dry. No rash noted. Not diaphoretic. No erythema. No pallor. NEUROLGIC: Alert and oriented to person, place, and time. Normal reflexes, muscle tone coordination. No cranial nerve deficit noted. PSYCHIATRIC: Normal mood and affect. Normal behavior. Normal judgment and thought content. CARDIOVASCULAR: Normal heart rate noted, regular rhythm RESPIRATORY: Effort and breath sounds normal, no problems with respiration noted MUSCULOSKELETAL: Normal range of motion. No edema and no tenderness. ABDOMEN: Soft, nontender, nondistended, gravid, cephalic. CERVIX: Dilation: Fingertip Effacement (%): 50 Cervical Position: Posterior Exam by:: LISA, CNM  Fetal monitoring: FHR: 140 bpm, Variability: moderate, Accelerations: Present, Decelerations: Absent  Uterine activity: occasional contractions per hour  CBC Latest Ref Rng & Units 01/01/2018 01/01/2018  WBC 4.0 - 10.5 K/uL 14.7(H) 13.5(H)  Hemoglobin 12.0 - 15.0 g/dL 10.8(L) 12.6  Hematocrit 36.0 - 46.0 % 32.0(L) 38.6   Platelets 150 - 400 K/uL 299 268   CMP Latest Ref Rng & Units 01/01/2018  Glucose 65 - 99 mg/dL 86  BUN 6 - 20 mg/dL <1(O<5(L)  Creatinine 1.090.44 - 1.00 mg/dL 6.040.69  Sodium 540135 - 981145 mmol/L 130(L)  Potassium 3.5 - 5.1 mmol/L 3.9  Chloride 101 - 111 mmol/L 100(L)  CO2 22 - 32 mmol/L 17(L)  Calcium 8.9 - 10.3 mg/dL 1.9(J8.7(L)  Total Protein 6.5 - 8.1 g/dL 6.8  Total Bilirubin 0.3 - 1.2 mg/dL 4.7(W1.3(H)  Alkaline Phos 38 - 126 U/L 290(H)  AST 15 - 41 U/L 55(H)  ALT 14 - 54 U/L 52    [place] Renal  Result Date: 01/01/2018 CLINICAL DATA:  Pyelonephritis.  Late third trimester pregnancy. EXAM: RENAL / URINARY TRACT ULTRASOUND COMPLETE COMPARISON:  None. FINDINGS: Right Kidney: Length: 14 cm.  Moderate hydronephrosis.  No renal mass. Left Kidney: Length: 12.9 cm.  Moderate hydronephrosis.  No renal mass. Bladder: Appears normal for degree of bladder distention. IMPRESSION: Moderate bilateral hydronephrosis. Electronically Signed   By: Awilda Metroourtnay  Bloomer M.D.   On: 01/01/2018 04:11    Current scheduled medications . docusate sodium  100 mg Oral Daily  . prenatal multivitamin  1 tablet Oral Q1200    I have reviewed the patient's current medications.  ASSESSMENT: Active Problems:   Pyelonephritis affecting pregnancy in third trimester   Pyelonephritis complicating pregnancy   PLAN: 1. Pyelonephritis - tempt to 102 last pm @ 2200 - cont rocephin - await blood/urine cultures  2. FWB - NST Q shift - PNV  3. Routine antepartum care - patient desires IOL for TOLAC   Continue routine antenatal care.   Baldemar LenisK. Meryl Akasia Ahmad, M.D. Attending Obstetrician & Gynecologist, [facility] for [facility], [facility]

## 2018-01-03 ENCOUNTER — Inpatient Hospital Stay (HOSPITAL_COMMUNITY): Payer: Medicaid Other

## 2018-01-03 DIAGNOSIS — O409XX0 Polyhydramnios, unspecified trimester, not applicable or unspecified: Secondary | ICD-10-CM | POA: Diagnosis not present

## 2018-01-03 LAB — CBC
HCT: 34.5 % — ABNORMAL LOW (ref 36.0–46.0)
Hemoglobin: 11.5 g/dL — ABNORMAL LOW (ref 12.0–15.0)
MCH: 27.9 pg (ref 26.0–34.0)
MCHC: 33.3 g/dL (ref 30.0–36.0)
MCV: 83.7 fL (ref 78.0–100.0)
PLATELETS: 423 10*3/uL — AB (ref 150–400)
RBC: 4.12 MIL/uL (ref 3.87–5.11)
RDW: 14.7 % (ref 11.5–15.5)
WBC: 7.5 10*3/uL (ref 4.0–10.5)

## 2018-01-03 LAB — URINE CULTURE: Culture: <10000 — AB

## 2018-01-03 LAB — TYPE AND SCREEN
ABO/RH(D): B POS
Antibody Screen: NEGATIVE

## 2018-01-03 MED ORDER — FLEET ENEMA 7-19 GM/118ML RE ENEM: 1.0000 | ENEMA | RECTAL | Status: DC | PRN

## 2018-01-03 MED ORDER — OXYCODONE-ACETAMINOPHEN 5-325 MG PO TABS: 2.0000 | ORAL_TABLET | ORAL | Status: DC | PRN

## 2018-01-03 MED ORDER — TERBUTALINE SULFATE 1 MG/ML IJ SOLN
0.2500 mg | Freq: Once | INTRAMUSCULAR | Status: DC | PRN
  Filled 2018-01-03: qty 1

## 2018-01-03 MED ORDER — OXYTOCIN 40 UNITS IN LACTATED RINGERS INFUSION - SIMPLE MED: 1.0000 m[IU]/min | INTRAVENOUS | Status: DC

## 2018-01-03 MED ORDER — ACETAMINOPHEN 325 MG PO TABS
650.0000 mg | ORAL_TABLET | ORAL | Status: DC | PRN
  Administered 2018-01-04: 650 mg via ORAL
  Filled 2018-01-03: qty 2

## 2018-01-03 MED ORDER — ONDANSETRON HCL 4 MG/2ML IJ SOLN: 4.0000 mg | Freq: Four times a day (QID) | INTRAMUSCULAR | Status: DC | PRN

## 2018-01-03 MED ORDER — FENTANYL CITRATE (PF) 100 MCG/2ML IJ SOLN
100.0000 ug | INTRAMUSCULAR | Status: DC | PRN
  Administered 2018-01-03 – 2018-01-04 (×5): 100 ug via INTRAVENOUS
  Filled 2018-01-03 (×5): qty 2

## 2018-01-03 MED ORDER — OXYCODONE-ACETAMINOPHEN 5-325 MG PO TABS: 1.0000 | ORAL_TABLET | ORAL | Status: DC | PRN

## 2018-01-03 MED ORDER — OXYTOCIN BOLUS FROM INFUSION
500.0000 mL | Freq: Once | INTRAVENOUS | Status: AC
  Administered 2018-01-04: 500 mL via INTRAVENOUS

## 2018-01-03 MED ORDER — LIDOCAINE HCL (PF) 1 % IJ SOLN
30.0000 mL | INTRAMUSCULAR | Status: DC | PRN
  Filled 2018-01-03: qty 30

## 2018-01-03 MED ORDER — OXYTOCIN 40 UNITS IN LACTATED RINGERS INFUSION - SIMPLE MED
2.5000 [IU]/h | INTRAVENOUS | Status: DC
  Filled 2018-01-03: qty 1000

## 2018-01-03 MED ORDER — OXYTOCIN 40 UNITS IN LACTATED RINGERS INFUSION - SIMPLE MED
1.0000 m[IU]/min | INTRAVENOUS | Status: DC
  Administered 2018-01-03: 1 m[IU]/min via INTRAVENOUS

## 2018-01-03 MED ORDER — LACTATED RINGERS IV SOLN: 500.0000 mL | INTRAVENOUS | Status: DC | PRN

## 2018-01-03 MED ORDER — SOD CITRATE-CITRIC ACID 500-334 MG/5ML PO SOLN: 30.0000 mL | ORAL | Status: DC | PRN

## 2018-01-03 MED ORDER — LACTATED RINGERS IV SOLN
INTRAVENOUS | Status: DC
  Administered 2018-01-03 – 2018-01-04 (×4): via INTRAVENOUS

## 2018-01-03 NOTE — Progress Notes (Signed)
FACULTY PRACTICE ANTEPARTUM PROGRESS NOTE  [name] is a 22 y.o. G2P1001 at 6680w4d who is admitted for pyelonephritis.  Estimated Date of Delivery: 01/06/18 Fetal presentation is cephalic.  Length of Stay:  2 Days. Admitted 12/31/2017  Subjective: Patient reports normal fetal movement.  She reports occasional contractions, denies bleeding and leaking of fluid per vagina. Overall, she is feeling better.  Vitals:  Blood pressure 110/70, pulse 100, temperature 98.3 F (36.8 C), temperature source Oral, resp. rate 18, height 5\' 5"  (1.651 m), weight 204 lb (92.5 kg), last menstrual period 03/17/2017, SpO2 98 %. Physical Examination: CONSTITUTIONAL: Well-developed, well-nourished female in no acute distress.  HENT:  Normocephalic, atraumatic, External right and left ear normal. Oropharynx is clear and moist EYES: Conjunctivae and EOM are normal. Pupils are equal, round, and reactive to light. No scleral icterus.  NECK: Normal range of motion, supple, no masses. SKIN: Skin is warm and dry. No rash noted. Not diaphoretic. No erythema. No pallor. NEUROLGIC: Alert and oriented to person, place, and time. Normal reflexes, muscle tone coordination. No cranial nerve deficit noted. PSYCHIATRIC: Normal mood and affect. Normal behavior. Normal judgment and thought content. CARDIOVASCULAR: Normal heart rate noted, regular rhythm RESPIRATORY: Effort and breath sounds normal, no problems with respiration noted MUSCULOSKELETAL: Normal range of motion. No edema and no tenderness. ABDOMEN: Soft, nontender, nondistended, gravid. CERVIX: deferred  Fetal monitoring: FHR: 150 bpm, Variability: moderate, Accelerations: Present, Decelerations: Absent  Uterine activity: occasional contractions per hour  No results found for this or any previous visit (from the past 48 hour(s)).  No results found.  Current scheduled medications . docusate sodium  100 mg Oral Daily  . prenatal multivitamin  1 tablet Oral  Q1200    I have reviewed the patient's current medications.  ASSESSMENT: Active Problems:   Pyelonephritis affecting pregnancy in third trimester   Pyelonephritis complicating pregnancy   PLAN: 1. Pyelonephritis - now afebrile x 24 hrs - cont rocephin - bld cx neg - urine cx neg but taken 9 hours after abx started - once afebrile 48 hrs, will switch to PO  2. FWB - NST Q shift - PNV  3. Routine antepartum care - patient desires IOL for TOLAC - growth US today   Continue routine antenatal care.   Baldemar LenisK. Meryl Laureano Hetzer, M.D. Attending Obstetrician & Gynecologist, [facility] for [facility], [facility]

## 2018-01-03 NOTE — Progress Notes (Signed)
After a sterile vaginal exam, Dr. Nira Retortegele attempted to place a foley bulb.  Foley bulb placement was not achieved at this time.  Will continue with just pitocin and reassess at a later time.

## 2018-01-03 NOTE — Progress Notes (Signed)
Report given to Herbert SetaHeather, RN in L&D.  Pt taken to room 168 for inductions of labor. Carmelina DaneERRI L Divonte Senger, RN

## 2018-01-03 NOTE — Progress Notes (Signed)
LABOR PROGRESS NOTE  Loma MessingJacqueline Fonder is a 22 y.o. G2P1001 at 4012w4d  admitted for IOL for polyhydramnios  Subjective: Patient feeling some contractions.   Objective: BP 114/77   Pulse 78   Temp 98.3 F (36.8 C) (Oral)   Resp 16   Ht 5\' 5"  (1.651 m)   Wt 204 lb (92.5 kg)   LMP 03/17/2017   SpO2 98%   BMI 33.95 kg/m  or  Vitals:   01/03/18 1900 01/03/18 1930 01/03/18 2000 01/03/18 2030  BP: 110/73 114/84 118/82 114/77  Pulse: 86 85 80 78  Resp: 16 16 16    Temp:    98.3 F (36.8 C)  TempSrc:    Oral  SpO2:      Weight:      Height:        SVEL Dilation: 1 Effacement (%): Thick Cervical Position: Posterior Station: Ballotable Presentation: Vertex Exam by:: Dr. Nira Retortegele FHT: baseline rate 125, moderate varibility, +acel, no decel Toco: ctx q2 min  Assessment / Plan: 22 y.o. G2P1001 at 6212w4d here for IOL for polyhydramnios  Labor: Continue low dose Pit. Cervix very posterior at this time; will recheck in a couple of hours and attempt to placed FB Fetal Wellbeing:  Cat I Pain Control:  Per patient's request Anticipated MOD:  VBAC  Frederik PearJulie P Degele, MD 01/03/2018, 9:10 PM

## 2018-01-03 NOTE — Anesthesia Pain Management Evaluation Note (Signed)
  CRNA Pain Management Visit Note  Patient: Diane Carroll, 22 y.o., female  "Hello [facility] We have an anesthesia team available at all times to provide care throughout the hospital, including epidural management and anesthesia for C-section. I don't know your plan for the delivery whether it a natural birth, water birth, IV sedation, nitrous supplementation, doula or epidural, but we want to meet your pain goals."   1.Was your pain managed to your expectations on prior hospitalizations?   Yes   2.What is your expectation for pain management during this hospitalization?     Epidural  3.How can we help you reach that goal? epidural  Record the patient's initial score and the patient's pain goal.   Pain: 7  Pain Goal: 7 [facility] wants you to be able to say your pain was always managed very well.  Marlinda Miranda 01/03/2018

## 2018-01-03 NOTE — Progress Notes (Signed)
Dr. Nira Retortegele was at bedside for a sterile vaginal exam and to assess appropriateness of foley bulb placement. Due to cervix still being very posterior, provider has decided not to place foley bulb at this time, continue increasing pitocin until 6milliunits and evaluate again for foley bulb placement after a few hours (allowing for cervical ripening).

## 2018-01-03 NOTE — Progress Notes (Addendum)
De Burrs. Jacqeline Broers, RN spoke with Dr. Darin EngelsAbraham requesting an order to reduce pitocin for tachysystole.  Pitocin was reduced from 6 milliunits to 3 milliunits. RN will continue monitoring and increase or decrease pitocin as directed.   Patient is comfortable, with adequate rest and relaxing between contractions and fetal heart rate continues to show moderate variability and occasional accelerations.

## 2018-01-03 NOTE — Progress Notes (Signed)
OBSTETRIC ADMISSION HISTORY AND PHYSICAL  Ethel Meisenheimer is a 22 y.o. female G2P1001 with IUP at [redacted]w[redacted]d by [place] presenting for IOL for polyhydramnios.Patient was initially admitted 01/01/18 to high risk for fever, constant low back pain and intermittent lower abd pain. Patient was admitted for pyelonephritis and started on rocephin. Patient has been afebrile for over 24hours. Blood cultures neg thus far. Patient has a [place] today that showed polyhydramnios and EFW>90%. Patient was moved to L&D for induction.   She reports +FMs, No LOF, no VB, no blurry vision, headaches or peripheral edema, and RUQ pain.  She plans on breast  feeding. She request nexplanon for birth control. She [facility]   Dating: By [place] --->  Estimated Date of Delivery: 01/06/18  Sono:  01/03/18  @[redacted]w[redacted]d , CWD, normal anatomy, cephalic presentation, fundal placenta , 3870g, >90% EFW   Prenatal History/Complications:  Past Medical History: Past Medical History:  Diagnosis Date  . Medical history non-contributory   c-section in [place]   Past Surgical History: Past Surgical History:  Procedure Laterality Date  . CESAREAN SECTION      Obstetrical History: OB History    Gravida Para Term Preterm AB Living   2 1 1     1    SAB TAB Ectopic Multiple Live Births           1      Social History: Social History   Socioeconomic History  . Marital status: Single    Spouse name: None  . Number of children: None  . Years of education: None  . Highest education level: None  Social Needs  . Financial resource strain: None  . Food insecurity - worry: None  . Food insecurity - inability: None  . Transportation needs - medical: None  . Transportation needs - non-medical: None  Occupational History  . None  Tobacco Use  . Smoking status: Never Smoker  . Smokeless tobacco: Never Used  Substance and Sexual Activity  . Alcohol use: No    Frequency: Never  . Drug use: No  . Sexual activity: None   Other Topics Concern  . None  Social History Narrative  . None    Family History: History reviewed. No pertinent family history.  Allergies: No Known Allergies  Medications Prior to Admission  Medication Sig Dispense Refill Last Dose  . Prenatal Vit-Fe Fumarate-FA (PRENATAL VITAMIN PO) Take by mouth.   Taking     Review of Systems   All systems reviewed and negative except as stated in HPI  Blood pressure 110/75, pulse 94, temperature 97.9 F (36.6 C), temperature source Oral, resp. rate 20, height 5\' 5"  (1.651 m), weight 204 lb (92.5 kg), last menstrual period 03/17/2017, SpO2 98 %. General appearance: alert, cooperative and appears stated age Lungs: clear to auscultation bilaterally Heart: regular rate and rhythm Abdomen: soft, non-tender; bowel sounds normal Extremities: Homans sign is negative, no sign of DVT DTR's intact  Presentation: cephalic Fetal monitoringBaseline: 140 bpm Uterine activityNone Dilation: Fingertip Effacement (%): 50 Exam by:: LISA, CNM   Prenatal labs: ABO, Rh: --/--/B POS, B POS Performed at [facility], [address], [place], [place] [address]  [phone]) Antibody: NEG (02/04 0548) Rubella:  immune  RPR:   neg  HBsAg:   neg  HIV:   neg  GBS:   neg  1 hr Glucola normal  Genetic screening : not drawn at appropriate times   Anatomy US normal  Prenatal Transfer Tool  Maternal  Diabetes: No Genetic Screening:drawn at inappropriate times Maternal Ultrasounds/Referrals: Abnormal:  Findings:   Other:polyhydramnios  Fetal Ultrasounds or other Referrals:  None Maternal Substance Abuse:  No Significant Maternal Medications:  None Significant Maternal Lab Results: None  No results found for this or any previous visit (from the past 24 hour(s)).  Patient Active Problem List   Diagnosis Date Noted  . Polyhydramnios 01/03/2018  . Pyelonephritis affecting pregnancy in third trimester 01/01/2018  . Pyelonephritis complicating  pregnancy 01/01/2018  . History of cesarean delivery affecting pregnancy 12/01/2017  . Language barrier 12/01/2017  . Echogenic intracardiac focus of fetus on prenatal ultrasound 10/05/2017    Assessment/Plan:  Loma MessingJacqueline Spees is a 22 y.o. G2P1001 at 7038w4d here for IOL for polyhydramnios   #Labor:IOL for polyhydramnios will start with pitocin or FB and proceed #Pain: Per patient request #FWB: Category 1 #ID:  GBS neg , rocephin for polynephritis #MOF: breast  #MOC:nexplanon #Circ:  Boy unsure   Nigel Bridgemanourtland Kai Calico, MD  01/03/2018, 4:33 PM

## 2018-01-04 ENCOUNTER — Inpatient Hospital Stay (HOSPITAL_COMMUNITY): Payer: Medicaid Other | Admitting: Anesthesiology

## 2018-01-04 ENCOUNTER — Encounter (HOSPITAL_COMMUNITY): Payer: Self-pay | Admitting: *Deleted

## 2018-01-04 DIAGNOSIS — O34219 Maternal care for unspecified type scar from previous cesarean delivery: Secondary | ICD-10-CM | POA: Diagnosis not present

## 2018-01-04 DIAGNOSIS — Z3A39 39 weeks gestation of pregnancy: Secondary | ICD-10-CM

## 2018-01-04 DIAGNOSIS — O403XX0 Polyhydramnios, third trimester, not applicable or unspecified: Secondary | ICD-10-CM

## 2018-01-04 LAB — RPR: RPR: NONREACTIVE

## 2018-01-04 MED ORDER — TETANUS-DIPHTH-ACELL PERTUSSIS 5-2.5-18.5 LF-MCG/0.5 IM SUSP: 0.5000 mL | Freq: Once | INTRAMUSCULAR | Status: DC

## 2018-01-04 MED ORDER — PRENATAL MULTIVITAMIN CH
1.0000 | ORAL_TABLET | Freq: Every day | ORAL | Status: DC
  Administered 2018-01-05: 1 via ORAL
  Filled 2018-01-04: qty 1

## 2018-01-04 MED ORDER — GUAIFENESIN 100 MG/5ML PO SOLN
5.0000 mL | ORAL | Status: DC | PRN
  Administered 2018-01-04: 100 mg via ORAL
  Filled 2018-01-04 (×2): qty 15

## 2018-01-04 MED ORDER — SODIUM CHLORIDE 0.9 % IV SOLN: INTRAVENOUS | Status: DC

## 2018-01-04 MED ORDER — ONDANSETRON HCL 4 MG/2ML IJ SOLN
4.0000 mg | INTRAMUSCULAR | Status: DC | PRN
  Administered 2018-01-04: 4 mg via INTRAVENOUS
  Filled 2018-01-04: qty 2

## 2018-01-04 MED ORDER — LACTATED RINGERS IV SOLN: 500.0000 mL | Freq: Once | INTRAVENOUS | Status: DC

## 2018-01-04 MED ORDER — ONDANSETRON HCL 4 MG/2ML IJ SOLN: 4.0000 mg | Freq: Once | INTRAMUSCULAR | Status: AC

## 2018-01-04 MED ORDER — ACETAMINOPHEN 325 MG PO TABS: 650.0000 mg | ORAL_TABLET | ORAL | Status: DC | PRN

## 2018-01-04 MED ORDER — EPHEDRINE 5 MG/ML INJ
10.0000 mg | INTRAVENOUS | Status: DC | PRN
  Filled 2018-01-04: qty 2

## 2018-01-04 MED ORDER — SENNOSIDES-DOCUSATE SODIUM 8.6-50 MG PO TABS
2.0000 | ORAL_TABLET | ORAL | Status: DC
  Administered 2018-01-04 – 2018-01-05 (×2): 2 via ORAL
  Filled 2018-01-04 (×2): qty 2

## 2018-01-04 MED ORDER — BENZOCAINE-MENTHOL 20-0.5 % EX AERO: 1.0000 "application " | INHALATION_SPRAY | CUTANEOUS | Status: DC | PRN

## 2018-01-04 MED ORDER — LIDOCAINE HCL (PF) 1 % IJ SOLN
INTRAMUSCULAR | Status: DC | PRN
  Administered 2018-01-04 (×2): 4 mL via EPIDURAL

## 2018-01-04 MED ORDER — CEPHALEXIN 500 MG PO CAPS
500.0000 mg | ORAL_CAPSULE | Freq: Four times a day (QID) | ORAL | Status: DC
  Administered 2018-01-04 – 2018-01-06 (×6): 500 mg via ORAL
  Filled 2018-01-04 (×10): qty 1

## 2018-01-04 MED ORDER — WITCH HAZEL-GLYCERIN EX PADS: 1.0000 "application " | MEDICATED_PAD | CUTANEOUS | Status: DC | PRN

## 2018-01-04 MED ORDER — ONDANSETRON HCL 4 MG PO TABS: 4.0000 mg | ORAL_TABLET | ORAL | Status: DC | PRN

## 2018-01-04 MED ORDER — DIPHENHYDRAMINE HCL 50 MG/ML IJ SOLN: 12.5000 mg | INTRAMUSCULAR | Status: DC | PRN

## 2018-01-04 MED ORDER — PHENYLEPHRINE 40 MCG/ML (10ML) SYRINGE FOR IV PUSH (FOR BLOOD PRESSURE SUPPORT)
80.0000 ug | PREFILLED_SYRINGE | INTRAVENOUS | Status: DC | PRN
  Filled 2018-01-04: qty 5

## 2018-01-04 MED ORDER — METHYLERGONOVINE MALEATE 0.2 MG/ML IJ SOLN
INTRAMUSCULAR | Status: AC
  Administered 2018-01-04: 0.2 mg
  Filled 2018-01-04: qty 1

## 2018-01-04 MED ORDER — ZOLPIDEM TARTRATE 5 MG PO TABS: 5.0000 mg | ORAL_TABLET | Freq: Every evening | ORAL | Status: DC | PRN

## 2018-01-04 MED ORDER — SIMETHICONE 80 MG PO CHEW: 80.0000 mg | CHEWABLE_TABLET | ORAL | Status: DC | PRN

## 2018-01-04 MED ORDER — IBUPROFEN 600 MG PO TABS
600.0000 mg | ORAL_TABLET | Freq: Four times a day (QID) | ORAL | Status: DC
  Administered 2018-01-05 – 2018-01-06 (×5): 600 mg via ORAL
  Filled 2018-01-04 (×5): qty 1

## 2018-01-04 MED ORDER — DIBUCAINE 1 % RE OINT: 1.0000 "application " | TOPICAL_OINTMENT | RECTAL | Status: DC | PRN

## 2018-01-04 MED ORDER — DIPHENHYDRAMINE HCL 25 MG PO CAPS: 25.0000 mg | ORAL_CAPSULE | Freq: Four times a day (QID) | ORAL | Status: DC | PRN

## 2018-01-04 MED ORDER — COCONUT OIL OIL
1.0000 "application " | TOPICAL_OIL | Status: DC | PRN
  Administered 2018-01-04: 1 via TOPICAL
  Filled 2018-01-04: qty 120

## 2018-01-04 MED ORDER — PHENYLEPHRINE 40 MCG/ML (10ML) SYRINGE FOR IV PUSH (FOR BLOOD PRESSURE SUPPORT)
PREFILLED_SYRINGE | INTRAVENOUS | Status: AC
  Filled 2018-01-04: qty 10

## 2018-01-04 MED ORDER — FENTANYL 2.5 MCG/ML BUPIVACAINE 1/10 % EPIDURAL INFUSION (WH - ANES)
14.0000 mL/h | INTRAMUSCULAR | Status: DC | PRN
  Administered 2018-01-04 (×2): 14 mL/h via EPIDURAL
  Filled 2018-01-04: qty 100

## 2018-01-04 NOTE — Progress Notes (Signed)
Diane MessingJacqueline Carroll is a 22 y.o. G2P1001 at 4462w5d  admitted for induction of labor due to polyhydramnios,and she is very uncomfortable, tolerating labor poorly. With a translator, she requests cesarean if she cannot have pain relief. Pt has now progressed to 3 /20/-2 and we will place epidural.  Subjective: tolerates exams poorly   Objective: BP 111/72   Pulse 79   Temp 98.3 F (36.8 C) (Oral)   Resp 18   Ht 5\' 5"  (1.651 m)   Wt 204 lb (92.5 kg)   LMP 03/17/2017   SpO2 98%   BMI 33.95 kg/m  I/O last 3 completed shifts: In: -  Out: 300 [Urine:300] No intake/output data recorded.  FHT:  FHR: 135 bpm, variability: minimal ,  accelerations:  Present,  decelerations:  Absent UC:   regular, every 3 minutes SVE:   Dilation: 3 Effacement (%): 20 Station: Ballotable Exam by:: Dr. Emelda FearFerguson   station -2/-3 Labs: Lab Results  Component Value Date   WBC 7.5 01/03/2018   HGB 11.5 (L) 01/03/2018   HCT 34.5 (L) 01/03/2018   MCV 83.7 01/03/2018   PLT 423 (H) 01/03/2018    Assessment / Plan: Induction of labor due to polyhydramnios,  progressing well on pitocin  Labor: will place epidural and allow to labor if pt is interested in continuing with TOLAC Preeclampsia:   Fetal Wellbeing:  Category I Pain Control:  Epidural I/D:  n/a Anticipated MOD:  uncertain  Diane BurrowJohn V Diane Carroll 01/04/2018, 3:22 AM

## 2018-01-04 NOTE — Progress Notes (Signed)
Diane Carroll is a 22 y.o. G2P1001 at 578w4d  admitted for IOL for polyhydramnios  Subjective: Patient doing well. States she can feel contractions more strongly   Objective: BP 111/72   Pulse 79   Temp 98.3 F (36.8 C) (Oral)   Resp 16   Ht 5\' 5"  (1.651 m)   Wt 92.5 kg (204 lb)   LMP 03/17/2017   SpO2 98%   BMI 33.95 kg/m  I/O last 3 completed shifts: In: -  Out: 300 [Urine:300] No intake/output data recorded.  FHT:  FHR: 130 bpm, variability: moderate,  accelerations:  Present,  decelerations:  Absent UC:   irregular SVE:   Dilation: 1.5 Effacement (%): Thick Station: Ballotable Exam by:: Dr. Nira Retortegele   Labs: Lab Results  Component Value Date   WBC 7.5 01/03/2018   HGB 11.5 (L) 01/03/2018   HCT 34.5 (L) 01/03/2018   MCV 83.7 01/03/2018   PLT 423 (H) 01/03/2018    Assessment / Plan: IOl for polyhydramnios  Labor: Pitocin at 184miliunits/min, continue low dose pit. FB unable to be placed as cervix was too posterior. Can titrate pitocin as needed to produce adequate contractions  Fetal Wellbeing:  Category I Pain Control:  Per patient request I/D:  n/a Anticipated MOD:  VBAC  Diane ManisSherin Ayo Guarino, DO PGY-1 01/04/2018, 1:49 AM

## 2018-01-04 NOTE — Anesthesia Procedure Notes (Signed)
Epidural Patient location during procedure: OB Start time: 01/04/2018 3:30 AM  Staffing Anesthesiologist: Leonides GrillsEllender, Karianna Gusman P, MD Performed: anesthesiologist   Preanesthetic Checklist Completed: patient identified, site marked, pre-op evaluation, timeout performed, IV checked, risks and benefits discussed and monitors and equipment checked  Epidural Patient position: sitting Prep: DuraPrep Patient monitoring: heart rate, cardiac monitor, continuous pulse ox and blood pressure Approach: midline Location: L4-L5 Injection technique: LOR air  Needle:  Needle type: Tuohy  Needle gauge: 17 G Needle length: 9 cm Needle insertion depth: 6 cm Catheter type: closed end flexible Catheter size: 19 Gauge Catheter at skin depth: 11 cm Test dose: negative and Other  Assessment Events: blood not aspirated, injection not painful, no injection resistance and negative IV test  Additional Notes Informed consent obtained prior to proceeding including risk of failure, 1% risk of PDPH, risk of minor discomfort and bruising. Discussed alternatives to epidural analgesia and patient desires to proceed.  Timeout performed pre-procedure verifying patient name, procedure, and platelet count.  Patient tolerated procedure well. Reason for block:procedure for pain

## 2018-01-04 NOTE — Progress Notes (Addendum)
Diane Carroll is a 22 y.o. G2P1001 at 6145w5d  admitted for induction of labor due to polyhydramnios  Subjective: Doing well. Comfortable now with epidural. Complaining of a constant cough.   Objective: BP 122/82   Pulse 89   Temp 98.4 F (36.9 C) (Oral)   Resp 16   Ht 5\' 5"  (1.651 m)   Wt 204 lb (92.5 kg)   LMP 03/17/2017   SpO2 97%   BMI 33.95 kg/m  I/O last 3 completed shifts: In: -  Out: 300 [Urine:300] No intake/output data recorded.  FHT:  FHR: 125 bpm, variability: moderate,  accelerations:  Present,  decelerations:  Present prolonged decel UC:   regular, every 1-2 minutes SVE:   Dilation: 8 Effacement (%): 100 Station: -1 Exam by:: lee     Labs: Lab Results  Component Value Date   WBC 7.5 01/03/2018   HGB 11.5 (L) 01/03/2018   HCT 34.5 (L) 01/03/2018   MCV 83.7 01/03/2018   PLT 423 (H) 01/03/2018    Assessment / Plan: Induction of labor due to polyhydramnios Active labor TOLAC  Labor: active labor, pitocin now off due to prolonged decel, expectant managmenet Fetal Wellbeing:  Category II Pain Control:  Epidural Anticipated MOD:  NSVD  Cough medicine prescribed for patient  Diane [name], DO 01/04/2018, 9:45 AM

## 2018-01-04 NOTE — Plan of Care (Signed)
Progressing appropriately. To bathroom with assistance, able to void. Encouraged to call for assistance with breastfeeding, as needed, and [facility]. Communicated via in house interpreter.

## 2018-01-04 NOTE — Anesthesia Preprocedure Evaluation (Signed)
Anesthesia Evaluation  Patient identified by MRN, date of birth, ID band Patient awake    Reviewed: Allergy & Precautions, H&P , NPO status , Patient's Chart, lab work & pertinent test results  History of Anesthesia Complications Negative for: history of anesthetic complications  Airway Mallampati: II  TM Distance: >3 FB Neck ROM: full    Dental no notable dental hx. (+) Teeth Intact   Pulmonary neg pulmonary ROS,    Pulmonary exam normal breath sounds clear to auscultation       Cardiovascular negative cardio ROS Normal cardiovascular exam Rhythm:regular Rate:Normal     Neuro/Psych negative neurological ROS  negative psych ROS   GI/Hepatic negative GI ROS, Neg liver ROS,   Endo/Other  negative endocrine ROS  Renal/GU negative Renal ROS  negative genitourinary   Musculoskeletal   Abdominal (+) + obese,   Peds  Hematology  (+) anemia ,   Anesthesia Other Findings   Reproductive/Obstetrics (+) Pregnancy                             Anesthesia Physical Anesthesia Plan  ASA: II  Anesthesia Plan: Epidural   Post-op Pain Management:    Induction:   PONV Risk Score and Plan:   Airway Management Planned:   Additional Equipment:   Intra-op Plan:   Post-operative Plan:   Informed Consent: I have reviewed the patients History and Physical, chart, labs and discussed the procedure including the risks, benefits and alternatives for the proposed anesthesia with the patient or authorized representative who has indicated his/her understanding and acceptance.     Plan Discussed with:   Anesthesia Plan Comments:         Anesthesia Quick Evaluation  

## 2018-01-05 NOTE — Plan of Care (Signed)
POC discussed with pt, no needs at this time.

## 2018-01-05 NOTE — Anesthesia Postprocedure Evaluation (Signed)
Anesthesia Post Note  Patient: Diane Carroll  Procedure(s) Performed: AN AD HOC LABOR EPIDURAL     Patient location during evaluation: Mother Baby Anesthesia Type: Epidural Level of consciousness: awake, awake and alert and oriented Pain management: pain level controlled Vital Signs Assessment: post-procedure vital signs reviewed and stable Respiratory status: spontaneous breathing, nonlabored ventilation and respiratory function stable Cardiovascular status: stable Postop Assessment: no headache, no backache, patient able to bend at knees, no apparent nausea or vomiting and adequate PO intake Anesthetic complications: no    Last Vitals:  Vitals:   01/04/18 1310 01/05/18 0400  BP:  (!) 91/59  Pulse:  72  Resp:  18  Temp:  36.6 C  SpO2: 98%     Last Pain:  Vitals:   01/05/18 0547  TempSrc:   PainSc: 0-No pain   Pain Goal: Patients Stated Pain Goal: 0 (01/04/18 1540)               Lexy Meininger

## 2018-01-05 NOTE — Lactation Note (Signed)
This note was copied from a baby's chart. Lactation Consultation Note; [employer] interpreter Tobi Bastosnna #[id number] used for my visit. Mom has been giving bottles of formula. Breast fed her first baby for 2 days because she did not have enough milk. Reports she has put baby to the breast but has some pain with initial latch that eases off after a few minutes. Reviewed wide open mouth and keeping the baby close to the breast throughout the feeding. Encouraged to always breast feed first then offer formula if baby is still hungry. Baby had formula about 1 hour ago and is sleepy. Encouraged to call for assist prn. Spanish BF booklet given. No questions at present.  Patient Name: Diane Loma MessingJacqueline Oneil BMWUX'LToday's Date: 01/05/2018 Reason for consult: Initial assessment   Maternal Data Formula Feeding for Exclusion: No Does the patient have breastfeeding experience prior to this delivery?: Yes  Feeding    LATCH Score                   Interventions Interventions: Breast feeding basics reviewed  Lactation Tools Discussed/Used     Consult Status Consult Status: Follow-up Date: 01/06/18 Follow-up type: In-patient    Diane Carroll, Diane Carroll 01/05/2018, 10:31 AM

## 2018-01-05 NOTE — Progress Notes (Signed)
POSTPARTUM PROGRESS NOTE  Post Partum Day 1  Subjective:  Diane Carroll is a 22 y.o. G2P1001 s/p SLoma MessingVD at 2562w6d.  No acute events overnight.  Pt denies problems with ambulating, voiding or po intake.  She denies nausea or vomiting.  Pain is well controlled. Lochia Moderate.   Objective: Blood pressure (!) 91/59, pulse 72, temperature 97.8 F (36.6 C), temperature source Oral, resp. rate 18, height 5\' 5"  (1.651 m), weight 204 lb (92.5 kg), last menstrual period 03/17/2017, SpO2 98 %.  Physical Exam:  General: alert, cooperative and no distress Chest: no respiratory distress Heart:regular rate, distal pulses intact Abdomen: soft, nontender,  Uterine Fundus: firm, appropriately tender DVT Evaluation: No calf swelling or tenderness Extremities: no edema Skin: warm, dry  Recent Labs    01/03/18 1634  HGB 11.5*  HCT 34.5*    Assessment/Plan: Diane Carroll is a 22 y.o. G2P1001 s/p SVD at 3962w6d   PPD#1 - Doing well Contraception: Nexplanon Feeding: breast Pyelonephritis:  s/p Rocephin, now on Keflex, remain avefrile Dispo: Plan for discharge tomorroe.   LOS: 4 days   Kandra NicolasJulie P DegeleMD 01/05/2018, 6:37 AM

## 2018-01-06 ENCOUNTER — Encounter (HOSPITAL_COMMUNITY): Payer: Self-pay | Admitting: *Deleted

## 2018-01-06 LAB — CULTURE, BLOOD (ROUTINE X 2)
Culture: NO GROWTH
Culture: NO GROWTH
SPECIAL REQUESTS: ADEQUATE
Special Requests: ADEQUATE

## 2018-01-06 MED ORDER — CEPHALEXIN 500 MG PO CAPS: 500.0000 mg | ORAL_CAPSULE | Freq: Four times a day (QID) | ORAL | 0 refills | Status: AC

## 2018-01-06 MED ORDER — IBUPROFEN 600 MG PO TABS: 600.0000 mg | ORAL_TABLET | Freq: Four times a day (QID) | ORAL | 0 refills | Status: DC

## 2018-01-06 MED ORDER — SENNOSIDES-DOCUSATE SODIUM 8.6-50 MG PO TABS: 2.0000 | ORAL_TABLET | ORAL | 0 refills | Status: DC

## 2018-01-06 NOTE — Discharge Instructions (Signed)

## 2018-01-06 NOTE — Discharge Summary (Signed)
OB Discharge Summary     Patient Name: Diane Carroll DOB: [birth year] MRN: [id number]  Date of admission: 12/31/2017 Delivering MD: Caryl AdaPHELPS, JAZMA Y   Date of discharge: 01/06/2018  Admitting diagnosis: Fever, unspecified fever cause [R50.9] Term pregnancy [Z34.80] Intrauterine pregnancy: 9079w0d     Secondary diagnosis:  Active Problems:   History of cesarean delivery affecting pregnancy   Pyelonephritis affecting pregnancy in third trimester   Pyelonephritis complicating pregnancy   Polyhydramnios   VBAC, delivered  Additional problems: none     Discharge diagnosis: Term Pregnancy Delivered and pyelonephritis                                                                                                Post partum procedures:none  Augmentation: Pitocin  Complications: pyelonephritis   Hospital course:  Induction of Labor With Vaginal Delivery   22 y.o. yo G2P1001 at 6579w0d was admitted to the hospital 12/31/2017 and dx with pyelonephritis. She was on the High Risk OB unit and given Rocephin for tx. On a growth U/S 01/03/18 she was noted to have an AFI of 31.6cm and [facility].  Indication for induction: polyhydramnios. She labored off of Pitocin.  Patient had an uncomplicated labor course as follows: Membrane Rupture Time/Date: 8:30 AM ,01/04/2018   Intrapartum Procedures: Episiotomy: None [1]                                         Lacerations:  None [1]  Patient had delivery of a Viable infant.  Information for the patient's newborn:  Vella KohlerCarlos, Boy Diane [[id number]][[id number]]  Delivery Method: Vag-Spont   01/04/2018  Details of delivery can be found in separate delivery note.  Patient had a routine postpartum course. Was started on a 10d course of Keflex. Patient is discharged home 01/06/18 (last T 100.4 on 01/04/18).  Physical exam  Vitals:   01/04/18 1310 01/05/18 0400 01/05/18 1716 01/06/18 0530  BP:  (!) 91/59 100/67 111/79  Pulse:  72 68  (!) 55  Resp:  18 19 18   Temp:  97.8 F (36.6 C) 97.7 F (36.5 C) 97.6 F (36.4 C)  TempSrc:  Oral Oral Oral  SpO2: 98%     Weight:      Height:       General: alert, cooperative and no distress Lochia: appropriate Uterine Fundus: firm DVT Evaluation: No evidence of DVT seen on physical exam. No cords or calf tenderness. No significant calf/ankle edema. Labs: Lab Results  Component Value Date   WBC 7.5 01/03/2018   HGB 11.5 (L) 01/03/2018   HCT 34.5 (L) 01/03/2018   MCV 83.7 01/03/2018   PLT 423 (H) 01/03/2018   CMP Latest Ref Rng & Units 01/01/2018  Glucose 65 - 99 mg/dL 86  BUN 6 - 20 mg/dL <1(H<5(L)  Creatinine 0.860.44 - 1.00 mg/dL 5.780.69  Sodium 469135 - 629145 mmol/L 130(L)  Potassium 3.5 - 5.1 mmol/L 3.9  Chloride 101 - 111 mmol/L 100(L)  CO2 22 - 32 mmol/L 17(L)  Calcium 8.9 - 10.3 mg/dL 1.6(X)  Total Protein 6.5 - 8.1 g/dL 6.8  Total Bilirubin 0.3 - 1.2 mg/dL 0.9(U)  Alkaline Phos 38 - 126 U/L 290(H)  AST 15 - 41 U/L 55(H)  ALT 14 - 54 U/L 52    Discharge instruction: per After Visit Summary and "Baby and Me Booklet".  After visit meds:  Allergies as of 01/06/2018   No Known Allergies     Medication List    TAKE these medications   cephALEXin 500 MG capsule Commonly known as:  KEFLEX Take 1 capsule (500 mg total) by mouth every 6 (six) hours for 7 days.   ibuprofen 600 MG tablet Commonly known as:  ADVIL,MOTRIN Take 1 tablet (600 mg total) by mouth every 6 (six) hours.   PRENATAL VITAMIN PO Take by mouth.   senna-docusate 8.6-50 MG tablet Commonly known as:  Senokot-S Take 2 tablets by mouth daily. Start taking on:  01/07/2018       Diet: routine diet  Activity: Advance as tolerated. Pelvic rest for 6 weeks.   Outpatient follow up:2 weeks for f/u of pyelo, then 4-6 wks for PP visit Follow up Appt:No future appointments. Follow up Visit:No Follow-up on file. Follow-up Information    Department, [facility]. Schedule an appointment as soon  as possible for a visit in 1 week(s).   Why:  Please follow up in 4 weeks to ensure your infection is resolving. Please also follow up in 4 weeks for postpartum care Contact information: [address] [place] [address] [phone]           Postpartum contraception: Nexplanon  Newborn Data: Live born female  Birth Weight: 8 lb 2.5 oz (3700 g) APGAR: 9, 9  Newborn Delivery   Birth date/time:  01/04/2018 13:18:00 Delivery type:  VBAC, Spontaneous     Baby Feeding: Breast Disposition:home with mother   01/06/2018 Oralia Manis, DO  PGY-1  CNM attestation I have seen and examined this patient and agree with above documentation in the resident's note.   Diane Carroll is a 22 y.o. G2P1001 s/p VBAC.   Pain is well controlled.  Plan for birth control is Nexplanon.  Method of Feeding: breast  PE:  BP 111/79 (BP Location: Left Arm)   Pulse (!) 55   Temp 97.6 F (36.4 C) (Oral)   Resp 18   Ht 5\' 5"  (1.651 m)   Wt 92.5 kg (204 lb)   LMP 03/17/2017   SpO2 98%   BMI 33.95 kg/m  Fundus firm  Recent Labs    01/03/18 1634  HGB 11.5*  HCT 34.5*     Plan: discharge today - postpartum care discussed - f/u clinic in 2 weeks for f/u of pyelo, then 4-6 wks for postpartum visit - Keflex x 10d course   Cam Hai, CNM 9:50 AM 01/06/2018

## 2018-01-06 NOTE — Lactation Note (Signed)
This note was copied from a baby's chart. Lactation Consultation Note: [employer] interpreter [phone] used for my visit. Mom reports baby has been latching well but she does not have much milk. Encouraged to always breast feed first then give formula if baby is still hungry to promote a good milk supply. Baby asleep at present. Had 60 ml of formula at No questions at present. To call prn   Patient Name: Diane Carroll VZDGL'OToday's Date: 01/06/2018 Reason for consult: Follow-up assessment   Maternal Data Formula Feeding for Exclusion: No Has patient been taught Hand Expression?: Yes Does the patient have breastfeeding experience prior to this delivery?: Yes  Feeding Feeding Type: Bottle Fed - Formula Nipple Type: Slow - flow  LATCH Score                   Interventions    Lactation Tools Discussed/Used     Consult Status Consult Status: Complete    Pamelia HoitWeeks, Cande Mastropietro D 01/06/2018, 7:45 AM

## 2018-01-06 NOTE — Progress Notes (Signed)
Interpretor [phone] used during encounter   Post Partum Day 2 Subjective: no complaints, up ad lib, tolerating PO and + flatus. No urinary complaints of burning or pain. Denies flank pain. Afebrile since 01/04/18 @1610 .   Objective: Blood pressure 111/79, pulse (!) 55, temperature 97.6 F (36.4 C), temperature source Oral, resp. rate 18, height 5\' 5"  (1.651 m), weight 92.5 kg (204 lb), last menstrual period 03/17/2017, SpO2 98 %.  Physical Exam:  General: alert, cooperative and no distress Lochia: appropriate Uterine Fundus: firm DVT Evaluation: No evidence of DVT seen on physical exam. No cords or calf tenderness. No significant calf/ankle edema.  Recent Labs    01/03/18 1634  HGB 11.5*  HCT 34.5*    Assessment/Plan: Breastfeeding and Contraception Nexplanon. Planning on outpatient circumcision. Will continue Keflex. Will plan to discuss disposition with attending during rounds given history of pyelonephritis.    LOS: 5 days   Oralia ManisSherin Deeya Richeson, DO PGY-1 01/06/2018, 7:20 AM

## 2018-03-25 IMAGING — US US RENAL
1 series · 16 of 25 positions shown · non-contrast
Comparison: None.

CLINICAL DATA: Pyelonephritis.  Late third trimester pregnancy.

EXAM:
RENAL / URINARY TRACT ULTRASOUND COMPLETE

[Series 1: us renal · 16 of 33 slices shown]
[im 1/33]
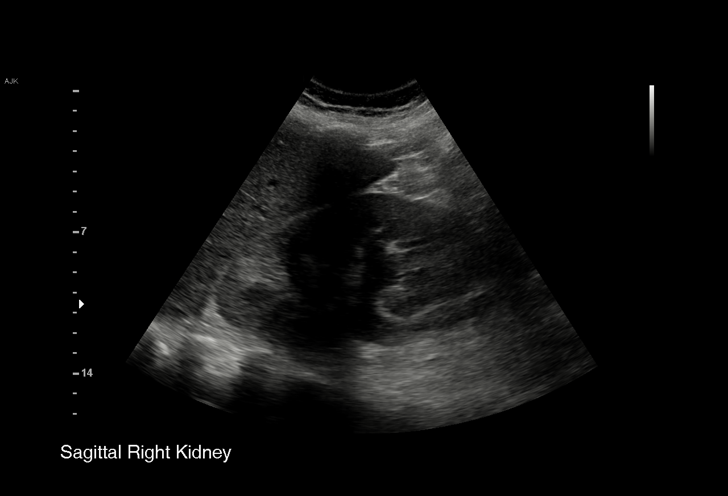
[im 3/33]
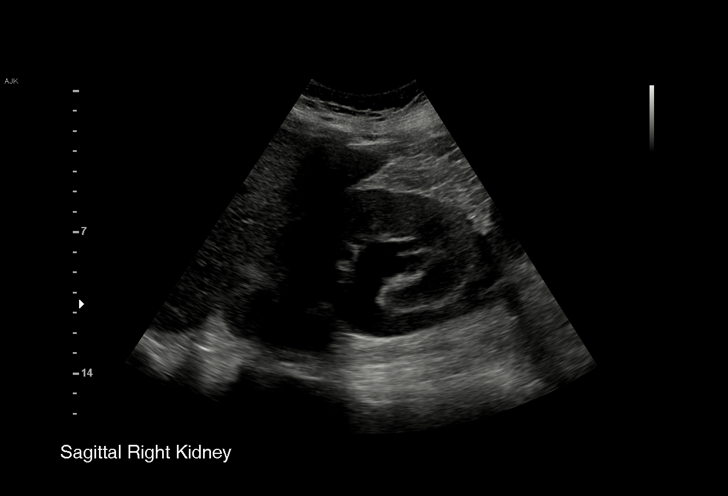
[im 5/33]
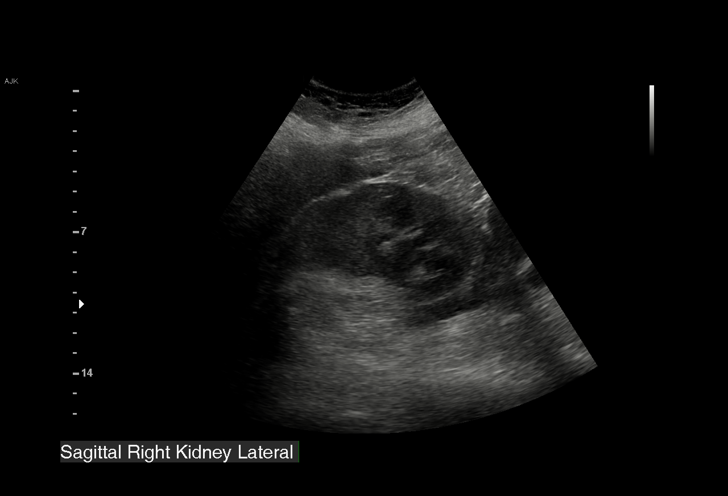
[im 7/33]
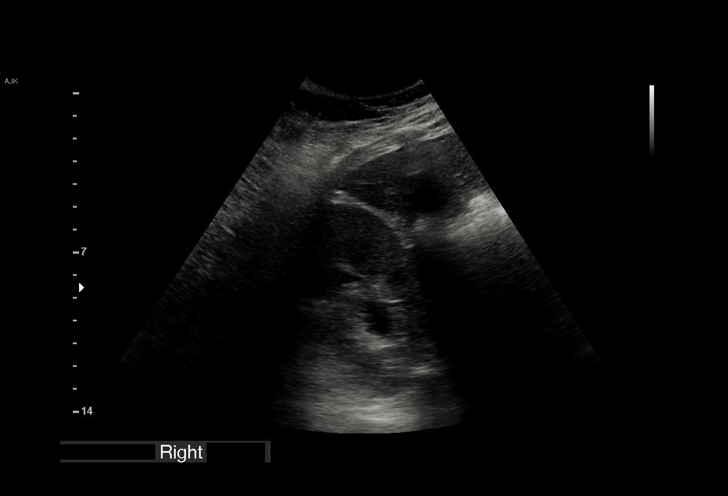
[im 10/33]
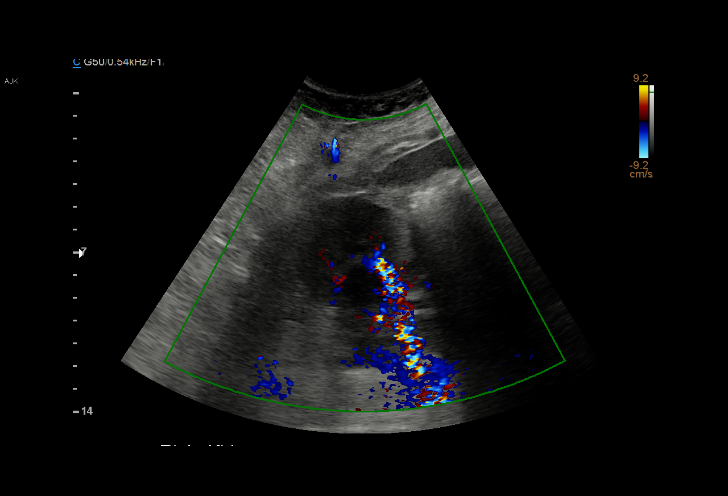
[im 11/33]
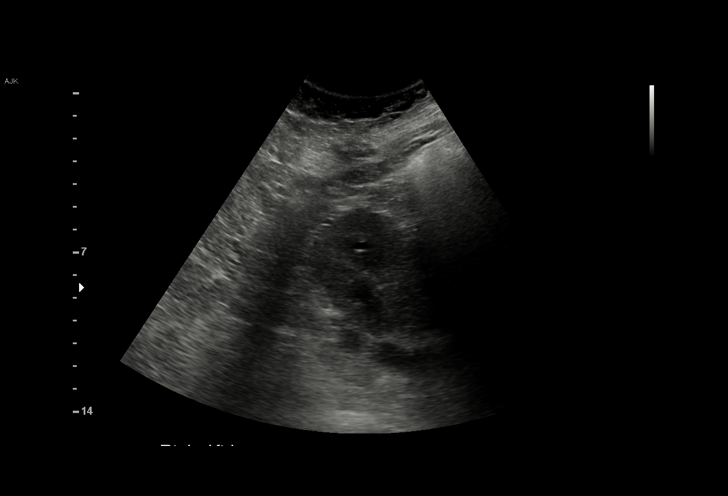
[im 14/33]
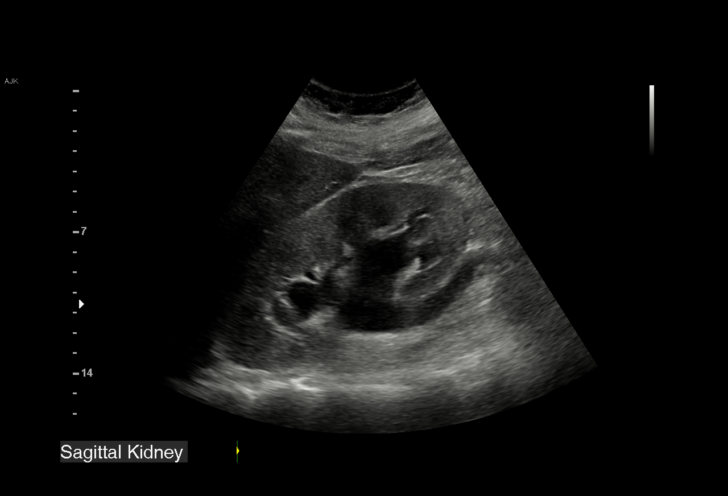
[im 15/33]
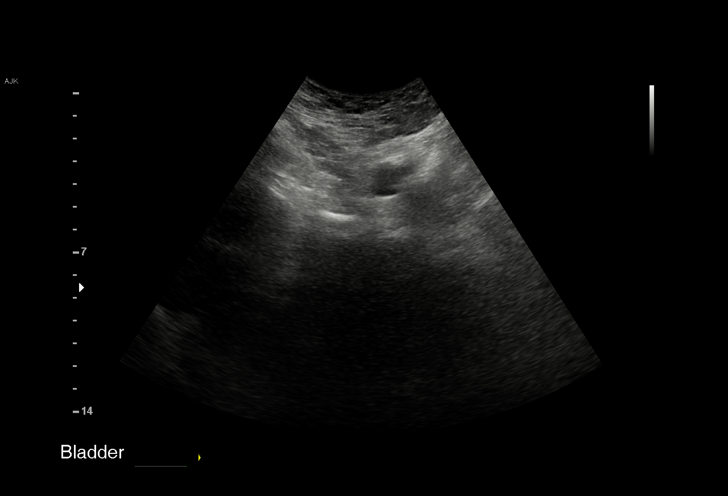
[im 18/33]
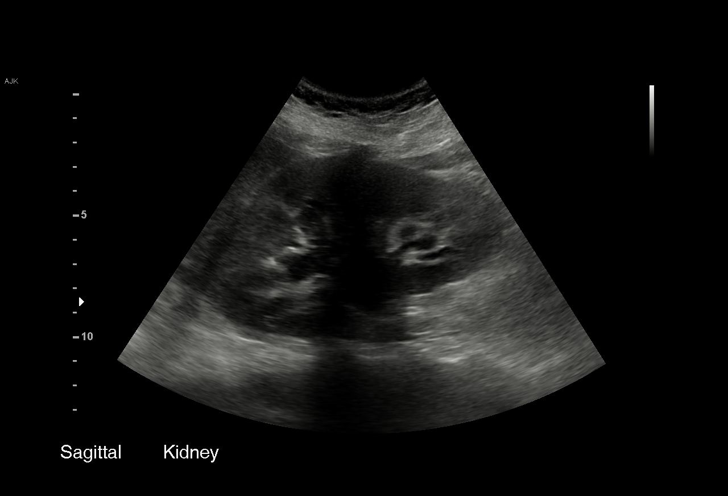
[im 19/33]
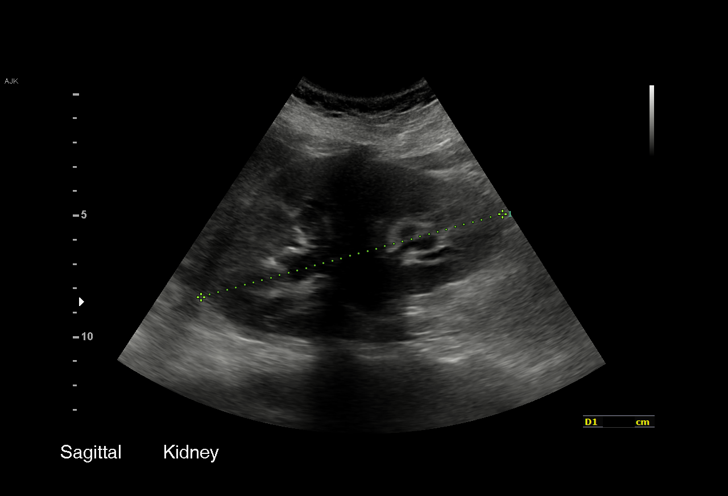
[im 22/33]
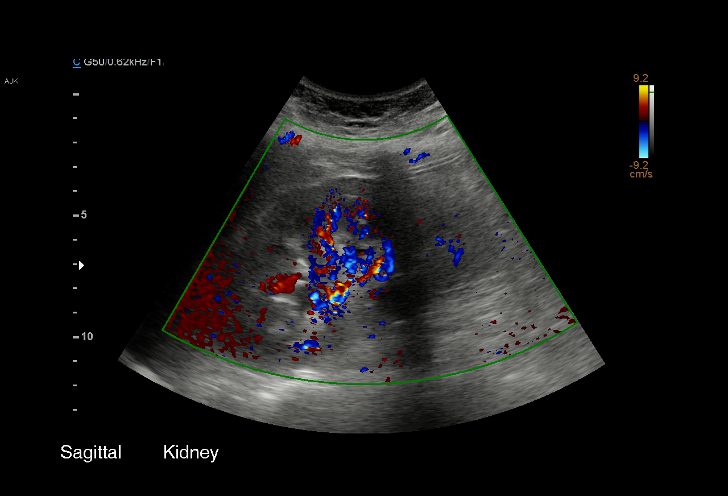
[im 23/33]
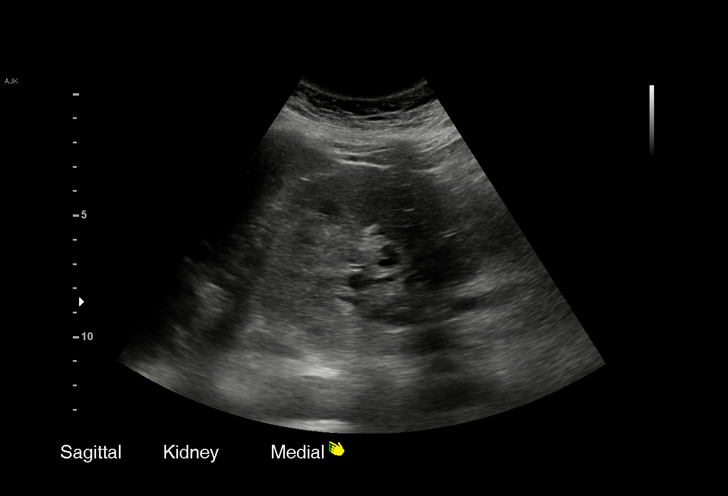
[im 26/33]
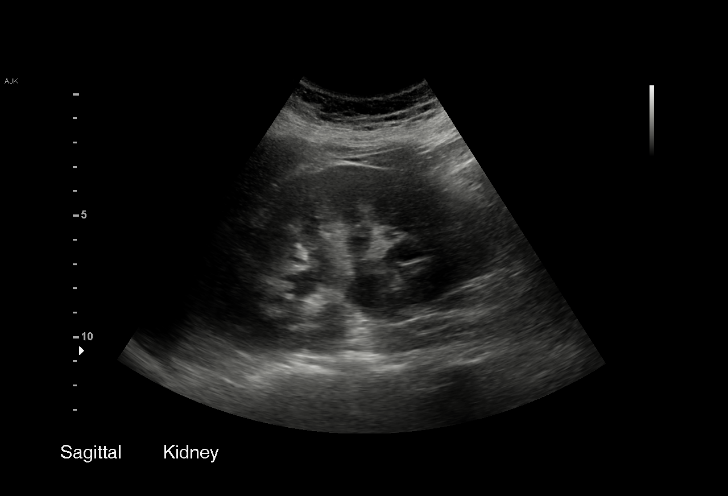
[im 29/33]
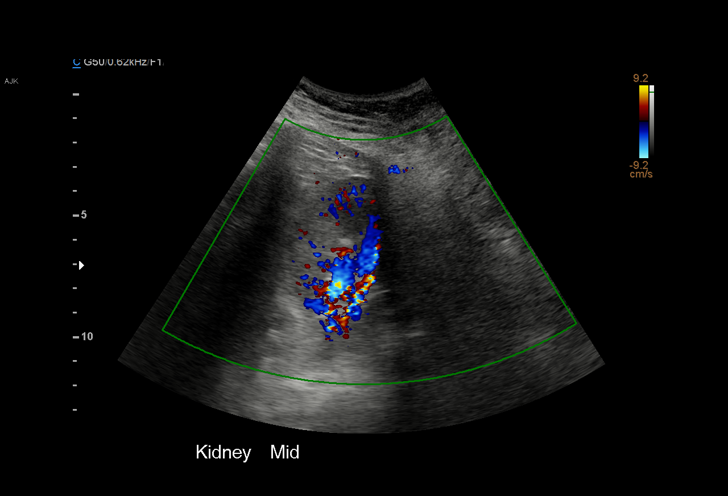
[im 30/33]
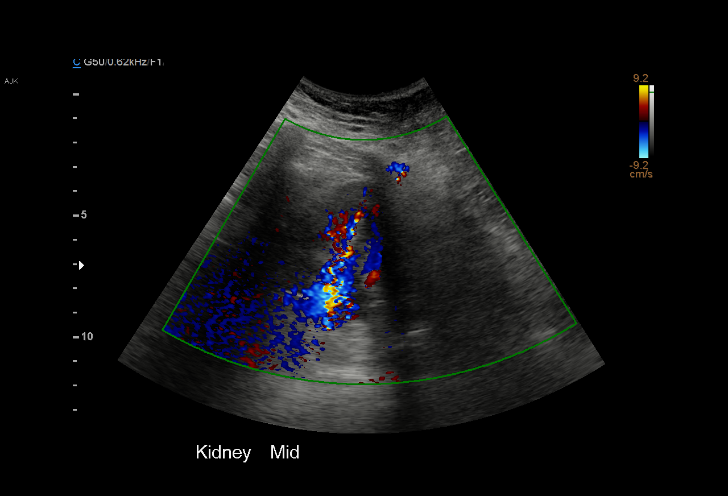
[im 33/33]
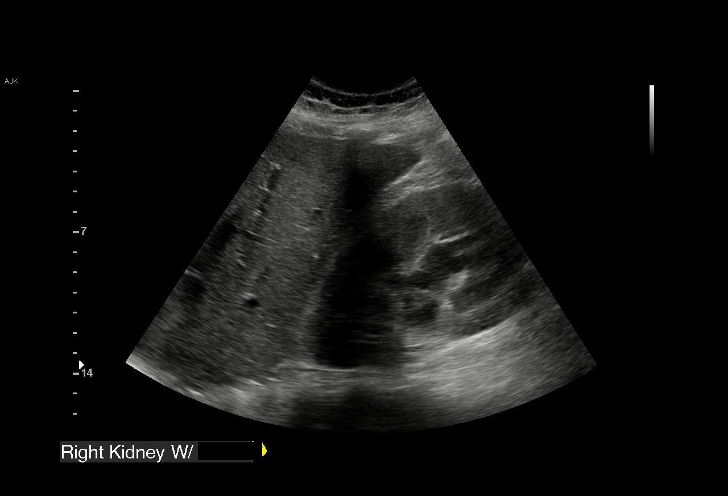

[16 of 25 positions shown; findings below may reference images not displayed]

FINDINGS: Right Kidney:

Length: 14 cm.  Moderate hydronephrosis.  No renal mass.

Left Kidney:

Length: 12.9 cm.  Moderate hydronephrosis.  No renal mass.

Bladder:

Appears normal for degree of bladder distention.
IMPRESSION: Moderate bilateral hydronephrosis.

## 2021-11-08 ENCOUNTER — Emergency Department (HOSPITAL_COMMUNITY): Payer: Medicaid Other

## 2021-11-08 ENCOUNTER — Encounter (HOSPITAL_COMMUNITY): Payer: Self-pay | Admitting: Emergency Medicine

## 2021-11-08 ENCOUNTER — Emergency Department (HOSPITAL_COMMUNITY)
Admission: EM | Admit: 2021-11-08 | Discharge: 2021-11-09 | Disposition: A | Discharge status: Home / Self Care | Payer: Medicaid Other | Attending: Emergency Medicine | Admitting: Emergency Medicine

## 2021-11-08 ENCOUNTER — Other Ambulatory Visit: Payer: Self-pay

## 2021-11-08 DIAGNOSIS — K8020 Calculus of gallbladder without cholecystitis without obstruction: Secondary | ICD-10-CM | POA: Insufficient documentation

## 2021-11-08 DIAGNOSIS — R1011 Right upper quadrant pain: Secondary | ICD-10-CM

## 2021-11-08 LAB — LIPASE, BLOOD: Lipase: 49 U/L (ref 11–51)

## 2021-11-08 LAB — COMPREHENSIVE METABOLIC PANEL
ALT: 15 U/L (ref 0–44)
AST: 18 U/L (ref 15–41)
Albumin: 3.7 g/dL (ref 3.5–5.0)
Alkaline Phosphatase: 76 U/L (ref 38–126)
Anion gap: 6 (ref 5–15)
BUN: 8 mg/dL (ref 6–20)
CO2: 26 mmol/L (ref 22–32)
Calcium: 8.7 mg/dL — ABNORMAL LOW (ref 8.9–10.3)
Chloride: 106 mmol/L (ref 98–111)
Creatinine, Ser: 0.6 mg/dL (ref 0.44–1.00)
GFR, Estimated: >60 mL/min (ref >60)
Glucose, Bld: 103 mg/dL — ABNORMAL HIGH (ref 70–99)
Potassium: 3.4 mmol/L — ABNORMAL LOW (ref 3.5–5.1)
Sodium: 138 mmol/L (ref 135–145)
Total Bilirubin: 0.4 mg/dL (ref 0.3–1.2)
Total Protein: 6.9 g/dL (ref 6.5–8.1)

## 2021-11-08 LAB — URINALYSIS, ROUTINE W REFLEX MICROSCOPIC
Bilirubin Urine: NEGATIVE
Glucose, UA: NEGATIVE mg/dL
Ketones, ur: NEGATIVE mg/dL
Nitrite: NEGATIVE
Protein, ur: NEGATIVE mg/dL
Specific Gravity, Urine: >1.03 — ABNORMAL HIGH (ref 1.005–1.030)
pH: 6 (ref 5.0–8.0)

## 2021-11-08 LAB — CBC WITH DIFFERENTIAL/PLATELET
Abs Immature Granulocytes: 0.01 10*3/uL (ref 0.00–0.07)
Basophils Absolute: 0 10*3/uL (ref 0.0–0.1)
Basophils Relative: 0 %
Eosinophils Absolute: 0.2 10*3/uL (ref 0.0–0.5)
Eosinophils Relative: 3 %
HCT: 37.5 % (ref 36.0–46.0)
Hemoglobin: 11.9 g/dL — ABNORMAL LOW (ref 12.0–15.0)
Immature Granulocytes: 0 %
Lymphocytes Relative: 27 %
Lymphs Abs: 2.1 10*3/uL (ref 0.7–4.0)
MCH: 27.6 pg (ref 26.0–34.0)
MCHC: 31.7 g/dL (ref 30.0–36.0)
MCV: 87 fL (ref 80.0–100.0)
Monocytes Absolute: 0.6 10*3/uL (ref 0.1–1.0)
Monocytes Relative: 8 %
Neutro Abs: 4.7 10*3/uL (ref 1.7–7.7)
Neutrophils Relative %: 62 %
Platelets: 339 10*3/uL (ref 150–400)
RBC: 4.31 MIL/uL (ref 3.87–5.11)
RDW: 12.8 % (ref 11.5–15.5)
WBC: 7.6 10*3/uL (ref 4.0–10.5)
nRBC: 0 % (ref 0.0–0.2)

## 2021-11-08 LAB — URINALYSIS, MICROSCOPIC (REFLEX)

## 2021-11-08 LAB — I-STAT BETA HCG BLOOD, ED (MC, WL, AP ONLY): I-stat hCG, quantitative: <5 m[IU]/mL (ref <5)

## 2021-11-08 NOTE — ED Provider Notes (Signed)
Emergency Medicine Provider Triage Evaluation Note  Diane Carroll , a 25 y.o. female  was evaluated in triage.  Pt complains of abd pain.  Located to right upper quadrant.  Goes into her back.  Began 2 hours ago after eating something.  No nausea or vomiting.  Only prior history of cesarean section.  Unsure if she may be pregnant.  No fever, diarrhea.  No urinary complaints.  Review of Systems  Positive: Right upper quadrant abdominal pain Negative: Diarrhea, emesis  Physical Exam  There were no vitals taken for this visit. Gen:   Awake, no distress   Resp:  Normal effort  ABD:  Diffuse tenderness to RUQ MSK:   Moves extremities without difficulty  Other:    Medical Decision Making  Medically screening exam initiated at 10:31 PM.  Appropriate orders placed.  Taylyn Brame was informed that the remainder of the evaluation will be completed by another provider, this initial triage assessment does not replace that evaluation, and the importance of remaining in the ED until their evaluation is complete.  RUQ pain   Kylan Veach A, PA-C 11/08/21 2235    Maia Plan, MD 11/11/21 1535

## 2021-11-08 NOTE — ED Triage Notes (Signed)
Patient with abdominal pain on the right upper quadrant and radiates to he back.  The pain started 2 hours ago, before eating.  No nausea or vomiting.

## 2021-11-09 MED ORDER — HYDROCODONE-ACETAMINOPHEN 5-325 MG PO TABS
1.0000 | ORAL_TABLET | Freq: Once | ORAL | Status: AC
  Administered 2021-11-09: 1 via ORAL
  Filled 2021-11-09: qty 1

## 2021-11-09 NOTE — Discharge Instructions (Addendum)
Follow-up with GI, call to schedule an appointment. Return to the emergency room for fevers, worsening pain, vomiting.  Seguimiento con GI, llame para programar una cita. Regrese a la sala de emergencias por fiebre, empeoramiento del dolor, vmitos.

## 2021-11-09 NOTE — ED Provider Notes (Signed)
[facility] EMERGENCY DEPARTMENT Provider Note   CSN: [id number] Arrival date & time: 11/08/21  2154     History Chief Complaint  Patient presents with   Abdominal Pain    Diane Carroll is a 25 y.o. female.  25 yo female with no significant past medical history with complaint of RUQ pain, constant, worsening over the past month, typically dull in nature. Pain is sharp and worse since last night, some relief with Tylenol. Not worse with eating, denies fevers, chills, cough, nausea, vomiting.       Past Medical History:  Diagnosis Date   Medical history non-contributory     Patient Active Problem List   Diagnosis Date Noted   VBAC, delivered 01/04/2018   Polyhydramnios 01/03/2018   Pyelonephritis affecting pregnancy in third trimester 01/01/2018   Pyelonephritis complicating pregnancy 32/95/1884   History of cesarean delivery affecting pregnancy 12/01/2017   Language barrier 12/01/2017   Echogenic intracardiac focus of fetus on prenatal ultrasound 10/05/2017    Past Surgical History:  Procedure Laterality Date   CESAREAN SECTION       OB History     Gravida  2   Para  1   Term  1   Preterm      AB      Living  1      SAB      IAB      Ectopic      Multiple      Live Births  1           History reviewed. No pertinent family history.  Social History   Tobacco Use   Smoking status: Never   Smokeless tobacco: Never  Substance Use Topics   Alcohol use: No   Drug use: No    Home Medications Prior to Admission medications   Medication Sig Start Date End Date Taking? Authorizing Provider  ibuprofen (ADVIL,MOTRIN) 600 MG tablet Take 1 tablet (600 mg total) by mouth every 6 (six) hours. 01/06/18   Caroline More, DO  Prenatal Vit-Fe Fumarate-FA (PRENATAL VITAMIN PO) Take by mouth.    [provider]  senna-docusate (SENOKOT-S) 8.6-50 MG tablet Take 2 tablets by mouth daily. 01/07/18   Caroline More, DO     Allergies    Patient has no known allergies.  Review of Systems   Review of Systems  Constitutional:  Negative for chills, diaphoresis and fever.  Respiratory:  Negative for shortness of breath.   Cardiovascular:  Negative for chest pain.  Gastrointestinal:  Positive for abdominal pain. Negative for constipation, diarrhea, nausea and vomiting.  Genitourinary:  Negative for dysuria.  Musculoskeletal:  Negative for arthralgias and myalgias.  Skin:  Negative for rash and wound.  Allergic/Immunologic: Negative for immunocompromised state.  Neurological:  Negative for weakness.  Hematological:  Negative for adenopathy.  Psychiatric/Behavioral:  Negative for confusion.   All other systems reviewed and are negative.  Physical Exam Updated Vital Signs BP 118/63   Pulse 80   Temp 98.3 F (36.8 C) (Oral)   Resp 17   LMP 09/30/2021 (Exact Date)   SpO2 98%   Physical Exam Vitals and nursing note reviewed.  Constitutional:      General: She is not in acute distress.    Appearance: She is well-developed. She is not diaphoretic.  HENT:     Head: Normocephalic and atraumatic.     Mouth/Throat:     Mouth: Mucous membranes are moist.  Cardiovascular:     Rate and  Rhythm: Normal rate and regular rhythm.     Heart sounds: Normal heart sounds.  Pulmonary:     Effort: Pulmonary effort is normal.     Breath sounds: Normal breath sounds.  Abdominal:     Palpations: Abdomen is soft.     Tenderness: There is no abdominal tenderness. There is no right CVA tenderness or left CVA tenderness. Negative signs include Shebra Muldrow's sign.  Skin:    General: Skin is warm and dry.  Neurological:     Mental Status: She is alert and oriented to person, place, and time.  Psychiatric:        Behavior: Behavior normal.    ED Results / Procedures / Treatments   Labs (all labs ordered are listed, but only abnormal results are displayed) Labs Reviewed  CBC WITH DIFFERENTIAL/PLATELET - Abnormal;  Notable for the following components:      Result Value   Hemoglobin 11.9 (*)    All other components within normal limits  COMPREHENSIVE METABOLIC PANEL - Abnormal; Notable for the following components:   Potassium 3.4 (*)    Glucose, Bld 103 (*)    Calcium 8.7 (*)    All other components within normal limits  URINALYSIS, ROUTINE W REFLEX MICROSCOPIC - Abnormal; Notable for the following components:   Specific Gravity, Urine >1.030 (*)    Hgb urine dipstick SMALL (*)    Leukocytes,Ua SMALL (*)    All other components within normal limits  URINALYSIS, MICROSCOPIC (REFLEX) - Abnormal; Notable for the following components:   Bacteria, UA RARE (*)    All other components within normal limits  LIPASE, BLOOD  I-STAT BETA HCG BLOOD, ED ([facility], [facility], [facility] ONLY)    EKG None  Radiology US Abdomen Limited RUQ (LIVER/GB)  Result Date: 11/08/2021 CLINICAL DATA:  Right upper quadrant abdominal pain. EXAM: ULTRASOUND ABDOMEN LIMITED RIGHT UPPER QUADRANT COMPARISON:  None. FINDINGS: Gallbladder: The gallbladder is filled with stones. The gallbladder wall is mildly thickened measuring 4 mm. No pericholecystic fluid. Negative sonographic Arleigh Dicola's sign. Common bile duct: Diameter: 5 mm Liver: No focal lesion identified. Within normal limits in parenchymal echogenicity. Portal vein is patent on color Doppler imaging with normal direction of blood flow towards the liver. Other: None. IMPRESSION: Cholelithiasis without definite sonographic evidence of acute cholecystitis. A hepatobiliary scintigraphy may provide better evaluation of the gallbladder if there is a high clinical concern for acute cholecystitis . Electronically Signed   By: Anner Crete M.D.   On: 11/08/2021 23:54    Procedures Procedures   Medications Ordered in ED Medications  HYDROcodone-acetaminophen (NORCO/VICODIN) 5-325 MG per tablet 1 tablet (has no administration in time range)    ED Course  I have reviewed the triage vital signs  and the nursing notes.  Pertinent labs & imaging results that were available during my care of the patient were reviewed by me and considered in my medical decision making (see chart for details).  Clinical Course as of 11/09/21 0735  Tue Nov 10, 2327  421 25 year old female with history of RUQ abdominal pain without fevers, vomiting. On exam, abdomen is non tender now 9 hours after her initial arrival in the ER.  Labs reassuring, WBC normal, lipase normal, LFTs/bili/alk phos normal.  Doubt acute cholecystitis, pancreatitis, choledocholithiasis.  US reveals cholelithiasis with gallbladder wall upper limits normal at 74m and CBD 54m  Patient is referred to GI for follow up with return to ER precautions discussed including fevers, vomiting, worsening pain.  [LM]  Clinical Course User Index [LM] Diane Carroll   MDM Rules/Calculators/A&P                           Final Clinical Impression(s) / ED Diagnoses Final diagnoses:  RUQ abdominal pain  Calculus of gallbladder without cholecystitis without obstruction    Rx / DC Orders ED Discharge Orders     None        Tacy Learn, PA-C 11/09/21 0735    Carmin Muskrat, MD 11/10/21 1339

## 2021-11-28 NOTE — L&D Delivery Note (Signed)
Delivery Note 26 y.o. G3P3003 at [redacted]w[redacted]d who arrived in active labor.   At 1:05 AM a viable female was delivered via VBAC, Spontaneous (Presentation: Right Occiput Anterior).  APGAR: 8, 9; weight 8 lb 13 oz (3997 g).   Placenta status: Spontaneous, Intact.  Cord: 3 vessels with the following complications: None.  Cord pH: not collected  Anesthesia: Epidural Episiotomy: None Lacerations: None Suture Repair:  n/a Est. Blood Loss (mL): 513  Mom to postpartum.  Baby to Couplet care / Skin to Skin.  Liliane Channel MD MPH OB Fellow, [place] for [facility] 09/23/2022

## 2022-01-30 IMAGING — US US ABDOMEN LIMITED
1 series · 14 of 25 positions shown · non-contrast
Comparison: None.

CLINICAL DATA: Right upper quadrant abdominal pain.

EXAM:
ULTRASOUND ABDOMEN LIMITED RIGHT UPPER QUADRANT

[Series 1: us abdomen limited ruq (liver/gb) · 39 acquisitions, 14 frames shown]
[im 1/39]
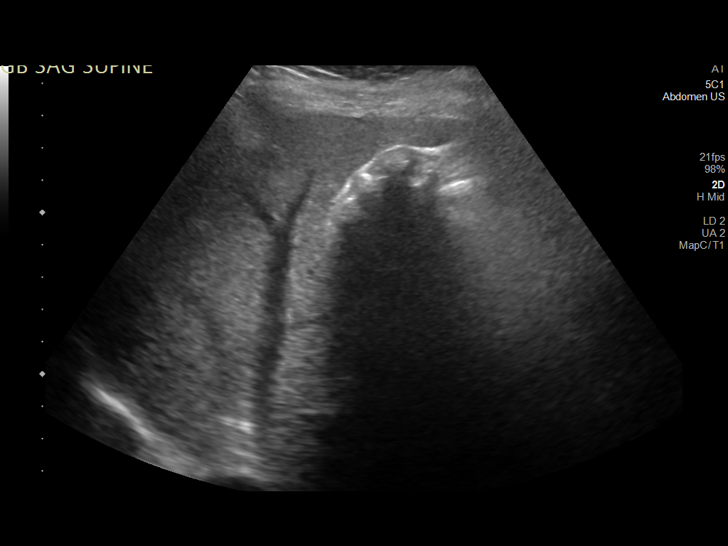
[im 4/39]
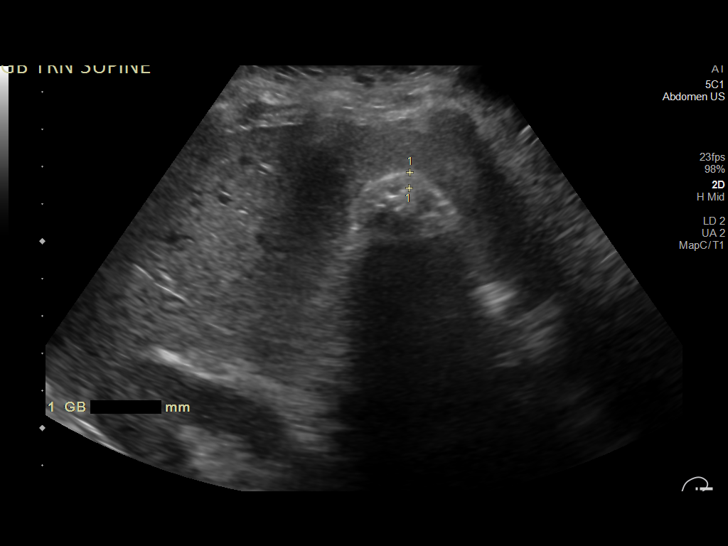
[im 7/39]
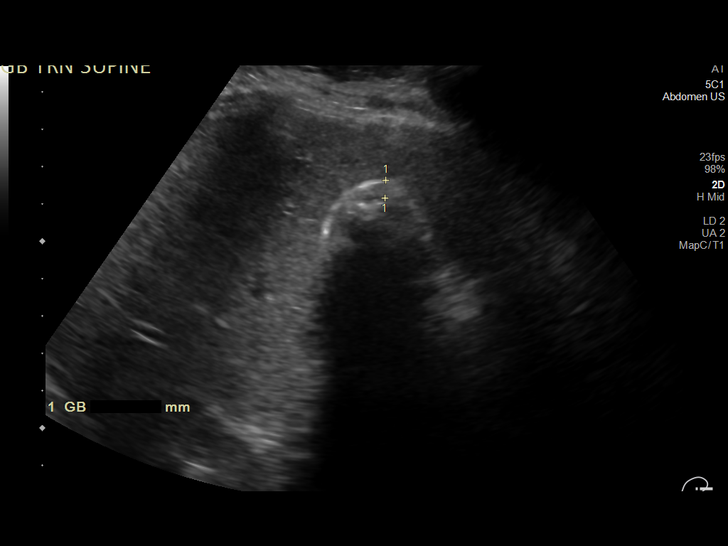
[im 10/39]
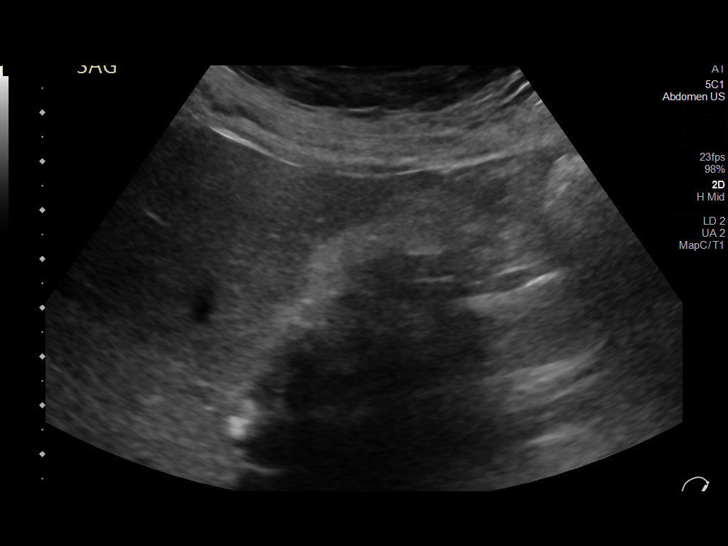
[im 13/39]
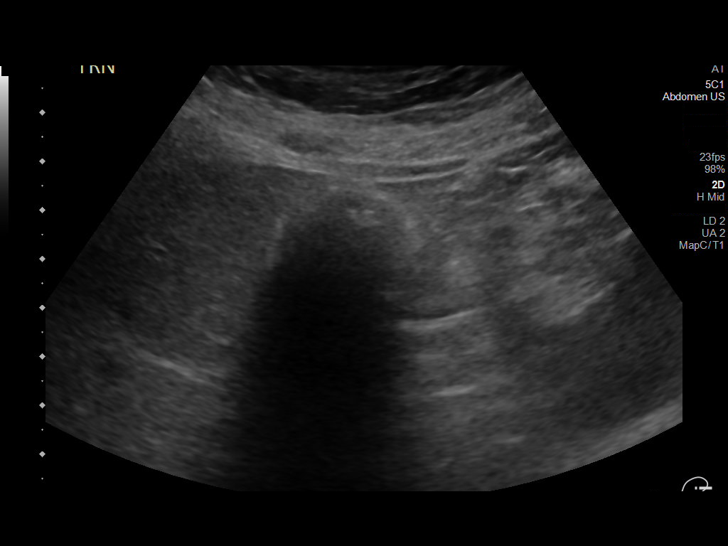
[im 15/39]
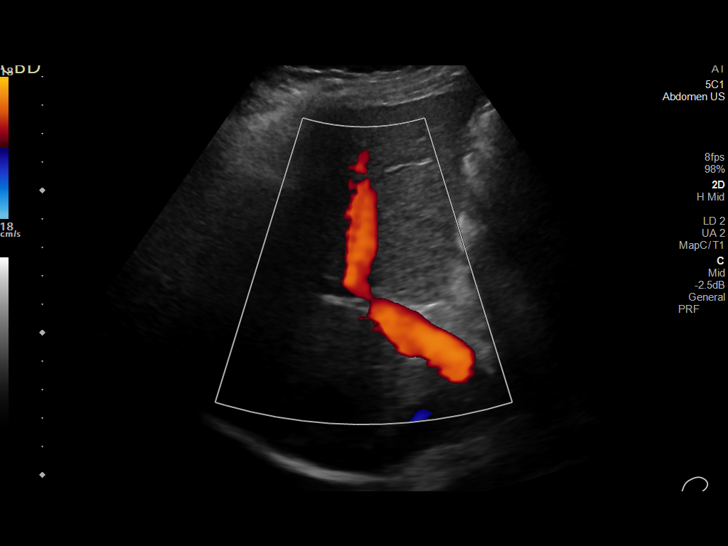
[im 18/39]
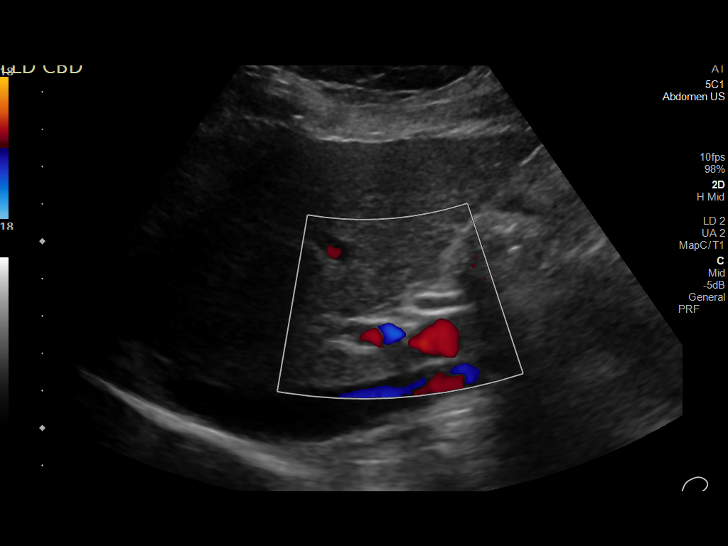
[im 21/39]
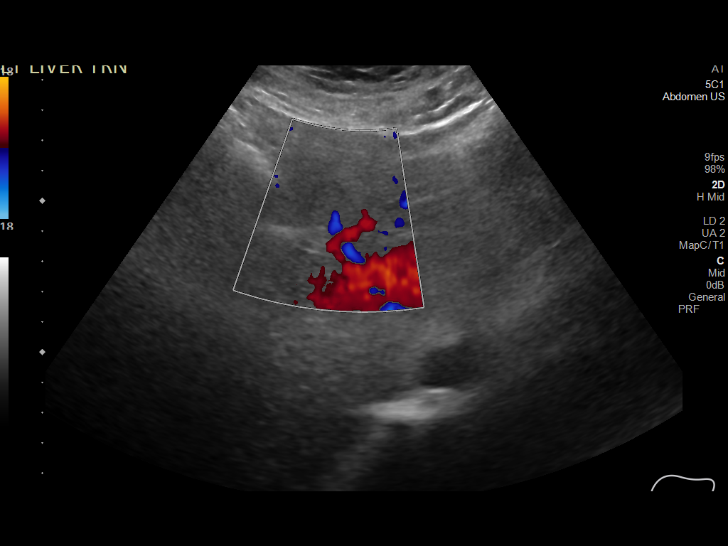
[im 24/39]
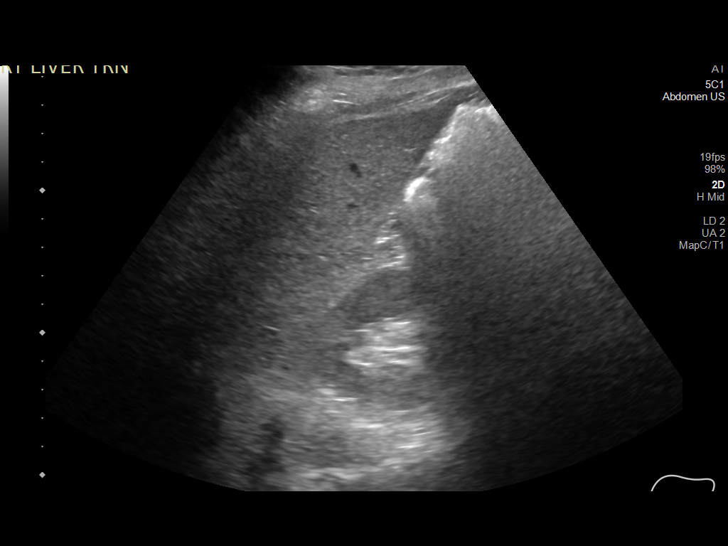
[im 26/39]
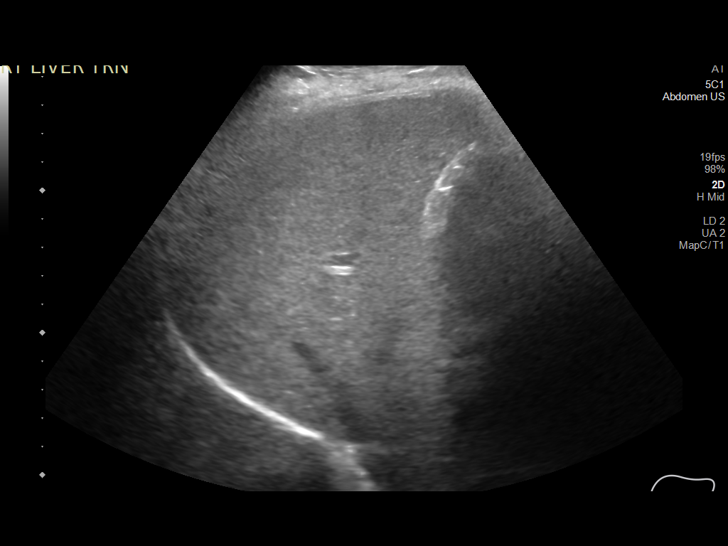
[im 29/39]
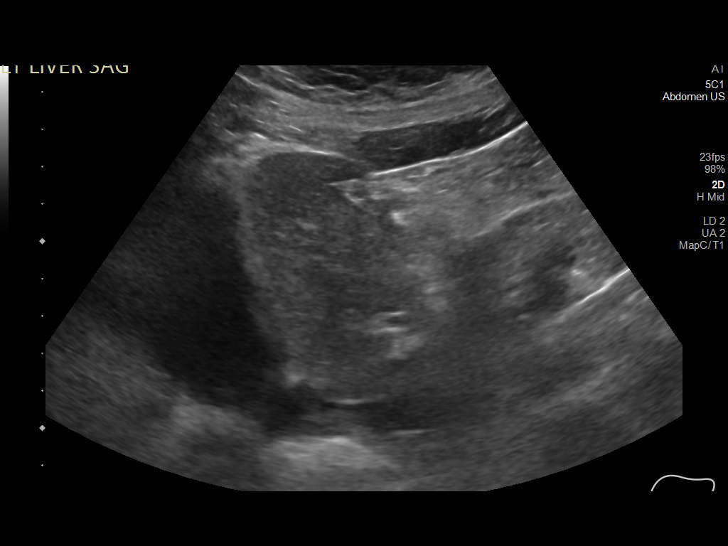
[im 32/39]
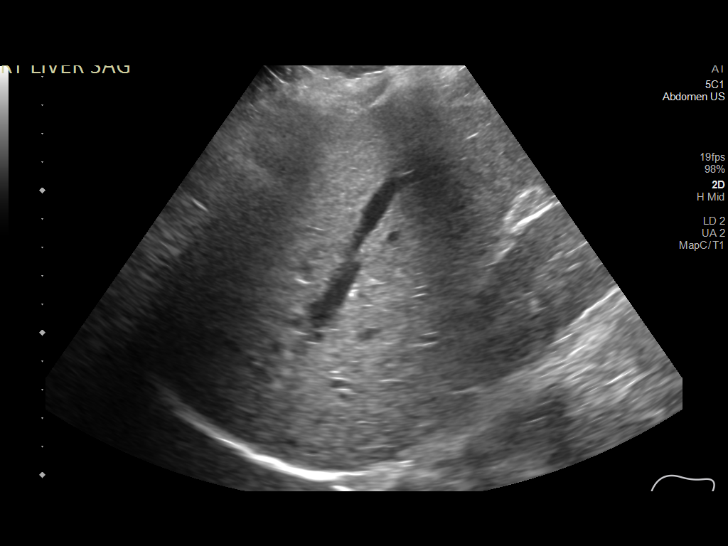
[im 35/39]
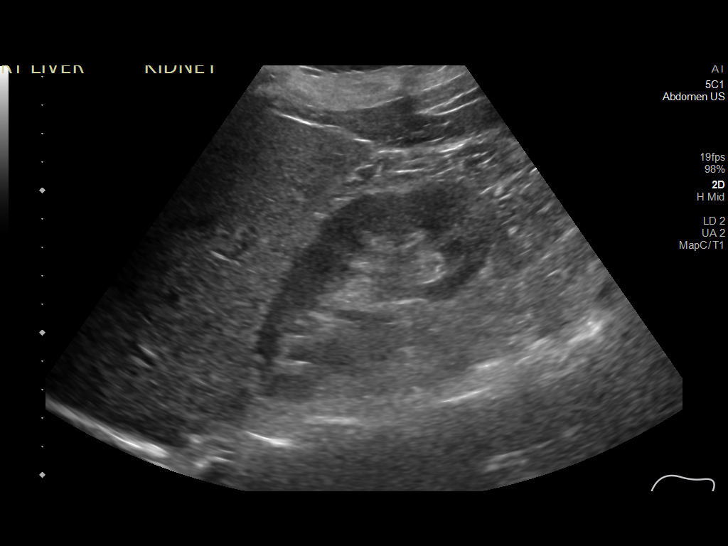
[im 39/39]
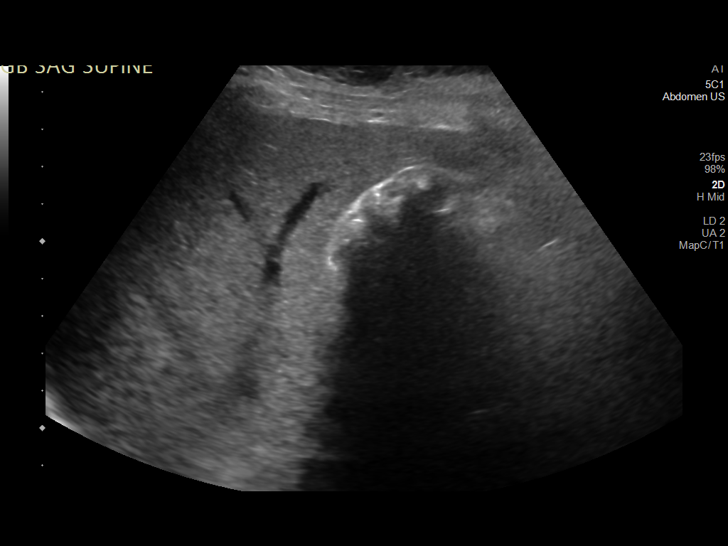

[14 of 25 positions shown; findings below may reference images not displayed]

FINDINGS: Gallbladder:

The gallbladder is filled with stones. The gallbladder wall is
mildly thickened measuring 4 mm. No pericholecystic fluid. Negative
sonographic Murphy's sign.

Common bile duct:

Diameter: 5 mm

Liver:

No focal lesion identified. Within normal limits in parenchymal
echogenicity. Portal vein is patent on color Doppler imaging with
normal direction of blood flow towards the liver.

Other: None.
IMPRESSION: Cholelithiasis without definite sonographic evidence of acute
cholecystitis. A hepatobiliary scintigraphy may provide better
evaluation of the gallbladder if there is a high clinical concern
for acute cholecystitis .

## 2022-03-28 LAB — OB RESULTS CONSOLE HEPATITIS B SURFACE ANTIGEN: Hepatitis B Surface Ag: NEGATIVE

## 2022-03-28 LAB — OB RESULTS CONSOLE RUBELLA ANTIBODY, IGM: Rubella: IMMUNE

## 2022-03-28 LAB — OB RESULTS CONSOLE GC/CHLAMYDIA
Chlamydia: NEGATIVE
Neisseria Gonorrhea: NEGATIVE

## 2022-03-28 LAB — OB RESULTS CONSOLE RPR: RPR: NONREACTIVE

## 2022-07-15 LAB — OB RESULTS CONSOLE HIV ANTIBODY (ROUTINE TESTING): HIV: NONREACTIVE

## 2022-08-26 LAB — OB RESULTS CONSOLE GBS: GBS: POSITIVE

## 2022-09-17 ENCOUNTER — Inpatient Hospital Stay (HOSPITAL_COMMUNITY)
Admission: AD | Admit: 2022-09-17 | Discharge: 2022-09-17 | Disposition: A | Discharge status: Home / Self Care | Payer: Medicaid Other | Attending: Family Medicine | Admitting: Family Medicine

## 2022-09-17 ENCOUNTER — Other Ambulatory Visit: Payer: Self-pay

## 2022-09-17 ENCOUNTER — Encounter: Payer: Self-pay | Admitting: Student

## 2022-09-17 DIAGNOSIS — O471 False labor at or after 37 completed weeks of gestation: Secondary | ICD-10-CM | POA: Insufficient documentation

## 2022-09-17 DIAGNOSIS — O479 False labor, unspecified: Secondary | ICD-10-CM

## 2022-09-17 DIAGNOSIS — Z3A39 39 weeks gestation of pregnancy: Secondary | ICD-10-CM | POA: Diagnosis not present

## 2022-09-17 NOTE — MAU Provider Note (Cosign Needed Addendum)
None      S: Ms. Estrellita Lasky is a 26 y.o. G3P2002 at [redacted]w[redacted]d  who presents to MAU today complaining contractions & pelvic pressure since this morning. She denies vaginal bleeding. She denies LOF. She reports normal fetal movement.    O: BP 105/76 (BP Location: Right Arm)   Pulse 73   Temp 98.2 F (36.8 C) (Oral)   Resp 16   Ht 5' (1.524 m)   Wt 82.8 kg   LMP 09/30/2021 (Exact Date)   SpO2 98%   BMI 35.64 kg/m  GENERAL: Well-developed, well-nourished female in no acute distress.  HEAD: Normocephalic, atraumatic.  CHEST: Normal effort of breathing, regular heart rate ABDOMEN: Soft, nontender, gravid  Cervical exam:  Dilation: 2 Effacement (%): Thick Cervical Position: Posterior Station: -3 Presentation: Vertex Exam by:: Jorje Guild NP   Fetal Monitoring: Baseline: 140 Variability: moderate Accelerations: 15x15 Decelerations: no Contractions: Q 2-15 minutes  Pt informed that the ultrasound is considered a limited OB ultrasound and is not intended to be a complete ultrasound exam.  Patient also informed that the ultrasound is not being completed with the intent of assessing for fetal or placental anomalies or any pelvic abnormalities.  Explained that the purpose of today's ultrasound is to assess for  presentation.  Patient acknowledges the purpose of the exam and the limitations of the study.   -Cephalic  A: SIUP at [redacted]w[redacted]d  False labor -Patient with minimal to no cervical change after 4 hour labor evaluation although contractions have become more regular. Given option of pain management vs recheck vs discharge home. Patient declines pain medication & prefers discharge home. Lives 10 minutes away & feels comfortable being able to return if needed. Has f/u with her OB on Monday.  -Reviewed prenatal record scanned under media. GBS positive.   P: Discharge home Reviewed labor precautions & fetal kick counts Keep OB appointment on Monday  Jorje Guild,  NP 09/17/2022 1:09 PM

## 2022-09-17 NOTE — MAU Note (Signed)
Diane Carroll is a 26 y.o. at [redacted]w[redacted]d here in MAU reporting: contractions with back pain and vaginal pressure that started to get stronger at 0400 today. Patient feels fetal movement, denies having any LOF or vaginal bleeding.  Onset of complaint: today @ 0400  Pain score: 8 Vitals:   09/17/22 0949 09/17/22 0958  BP: 110/72   Pulse: 75   Resp: 16   Temp:    SpO2: 96% 96%     FHT: 135

## 2022-09-22 ENCOUNTER — Inpatient Hospital Stay (HOSPITAL_COMMUNITY): Payer: Medicaid Other | Admitting: Anesthesiology

## 2022-09-22 ENCOUNTER — Encounter (HOSPITAL_COMMUNITY): Payer: Self-pay | Admitting: Family Medicine

## 2022-09-22 ENCOUNTER — Inpatient Hospital Stay (HOSPITAL_COMMUNITY)
Admission: AD | Admit: 2022-09-22 | Discharge: 2022-09-24 | DRG: 807 | Disposition: A | Discharge status: Home / Self Care | Payer: Medicaid Other | Attending: Family Medicine | Admitting: Family Medicine

## 2022-09-22 DIAGNOSIS — Z789 Other specified health status: Secondary | ICD-10-CM

## 2022-09-22 DIAGNOSIS — Z23 Encounter for immunization: Secondary | ICD-10-CM | POA: Diagnosis not present

## 2022-09-22 DIAGNOSIS — Z3A40 40 weeks gestation of pregnancy: Secondary | ICD-10-CM | POA: Diagnosis not present

## 2022-09-22 DIAGNOSIS — O479 False labor, unspecified: Principal | ICD-10-CM

## 2022-09-22 DIAGNOSIS — O99824 Streptococcus B carrier state complicating childbirth: Secondary | ICD-10-CM | POA: Diagnosis present

## 2022-09-22 DIAGNOSIS — Z3A39 39 weeks gestation of pregnancy: Secondary | ICD-10-CM

## 2022-09-22 DIAGNOSIS — O26893 Other specified pregnancy related conditions, third trimester: Secondary | ICD-10-CM | POA: Diagnosis present

## 2022-09-22 DIAGNOSIS — O34219 Maternal care for unspecified type scar from previous cesarean delivery: Principal | ICD-10-CM | POA: Diagnosis present

## 2022-09-22 DIAGNOSIS — Z833 Family history of diabetes mellitus: Secondary | ICD-10-CM

## 2022-09-22 DIAGNOSIS — O34211 Maternal care for low transverse scar from previous cesarean delivery: Secondary | ICD-10-CM | POA: Diagnosis not present

## 2022-09-22 DIAGNOSIS — N858 Other specified noninflammatory disorders of uterus: Secondary | ICD-10-CM | POA: Diagnosis present

## 2022-09-22 LAB — CBC
HCT: 38.3 % (ref 36.0–46.0)
Hemoglobin: 12.5 g/dL (ref 12.0–15.0)
MCH: 27.8 pg (ref 26.0–34.0)
MCHC: 32.6 g/dL (ref 30.0–36.0)
MCV: 85.3 fL (ref 80.0–100.0)
Platelets: 292 10*3/uL (ref 150–400)
RBC: 4.49 MIL/uL (ref 3.87–5.11)
RDW: 14.4 % (ref 11.5–15.5)
WBC: 8.3 10*3/uL (ref 4.0–10.5)
nRBC: 0 % (ref 0.0–0.2)

## 2022-09-22 MED ORDER — PHENYLEPHRINE 80 MCG/ML (10ML) SYRINGE FOR IV PUSH (FOR BLOOD PRESSURE SUPPORT)
80.0000 ug | PREFILLED_SYRINGE | INTRAVENOUS | Status: DC | PRN
  Filled 2022-09-22: qty 10

## 2022-09-22 MED ORDER — ACETAMINOPHEN 325 MG PO TABS: 650.0000 mg | ORAL_TABLET | ORAL | Status: DC | PRN

## 2022-09-22 MED ORDER — FENTANYL-BUPIVACAINE-NACL 0.5-0.125-0.9 MG/250ML-% EP SOLN
EPIDURAL | Status: DC | PRN
  Administered 2022-09-22: 12 mL/h via EPIDURAL

## 2022-09-22 MED ORDER — OXYTOCIN-SODIUM CHLORIDE 30-0.9 UT/500ML-% IV SOLN
2.5000 [IU]/h | INTRAVENOUS | Status: DC
  Filled 2022-09-22: qty 500

## 2022-09-22 MED ORDER — OXYTOCIN BOLUS FROM INFUSION
333.0000 mL | Freq: Once | INTRAVENOUS | Status: AC
  Administered 2022-09-23: 333 mL via INTRAVENOUS

## 2022-09-22 MED ORDER — SODIUM CHLORIDE 0.9 % IV SOLN
2.0000 g | Freq: Once | INTRAVENOUS | Status: AC
  Administered 2022-09-22: 2 g via INTRAVENOUS
  Filled 2022-09-22: qty 2000

## 2022-09-22 MED ORDER — LACTATED RINGERS IV SOLN: INTRAVENOUS | Status: DC

## 2022-09-22 MED ORDER — FLEET ENEMA 7-19 GM/118ML RE ENEM: 1.0000 | ENEMA | RECTAL | Status: DC | PRN

## 2022-09-22 MED ORDER — PHENYLEPHRINE 80 MCG/ML (10ML) SYRINGE FOR IV PUSH (FOR BLOOD PRESSURE SUPPORT)
80.0000 ug | PREFILLED_SYRINGE | INTRAVENOUS | Status: DC | PRN
  Administered 2022-09-23: 80 ug via INTRAVENOUS

## 2022-09-22 MED ORDER — LACTATED RINGERS IV SOLN
500.0000 mL | INTRAVENOUS | Status: DC | PRN
  Administered 2022-09-23: 500 mL via INTRAVENOUS

## 2022-09-22 MED ORDER — LACTATED RINGERS IV SOLN
500.0000 mL | Freq: Once | INTRAVENOUS | Status: AC
  Administered 2022-09-22: 500 mL via INTRAVENOUS

## 2022-09-22 MED ORDER — OXYCODONE-ACETAMINOPHEN 5-325 MG PO TABS: 1.0000 | ORAL_TABLET | ORAL | Status: DC | PRN

## 2022-09-22 MED ORDER — EPHEDRINE 5 MG/ML INJ: 10.0000 mg | INTRAVENOUS | Status: DC | PRN

## 2022-09-22 MED ORDER — ONDANSETRON HCL 4 MG/2ML IJ SOLN: 4.0000 mg | Freq: Four times a day (QID) | INTRAMUSCULAR | Status: DC | PRN

## 2022-09-22 MED ORDER — OXYCODONE-ACETAMINOPHEN 5-325 MG PO TABS: 2.0000 | ORAL_TABLET | ORAL | Status: DC | PRN

## 2022-09-22 MED ORDER — FENTANYL-BUPIVACAINE-NACL 0.5-0.125-0.9 MG/250ML-% EP SOLN
12.0000 mL/h | EPIDURAL | Status: DC | PRN
  Filled 2022-09-22: qty 250

## 2022-09-22 MED ORDER — SODIUM CHLORIDE 0.9 % IV SOLN: 1.0000 g | INTRAVENOUS | Status: DC

## 2022-09-22 MED ORDER — DIPHENHYDRAMINE HCL 50 MG/ML IJ SOLN: 12.5000 mg | INTRAMUSCULAR | Status: DC | PRN

## 2022-09-22 MED ORDER — LIDOCAINE HCL (PF) 1 % IJ SOLN: 30.0000 mL | INTRAMUSCULAR | Status: DC | PRN

## 2022-09-22 MED ORDER — LIDOCAINE HCL (PF) 1 % IJ SOLN
INTRAMUSCULAR | Status: DC | PRN
  Administered 2022-09-22: 2 mL via EPIDURAL
  Administered 2022-09-22: 8 mL via EPIDURAL

## 2022-09-22 MED ORDER — SOD CITRATE-CITRIC ACID 500-334 MG/5ML PO SOLN: 30.0000 mL | ORAL | Status: DC | PRN

## 2022-09-22 NOTE — H&P (Addendum)
OBSTETRIC ADMISSION HISTORY AND PHYSICAL  Diane Carroll is a 26 y.o. female G3P2002 with IUP at [redacted]w[redacted]d presenting for uterine contractions. She was noted to 7cm dilated on arrival in the MAU. She reports +FMs, No LOF, no VB, no blurry vision, headaches or peripheral edema, and RUQ pain.  She plans on breast and bottle feeding. She request nexplanon for birth control. She received her prenatal care at [facility]: By 58 wk [place] --->  Estimated Date of Delivery: 09/23/22  Sono:    @[redacted]w[redacted]d , CWD, normal anatomy, vertex presentation, 41% EFW   Prenatal History/Complications:  Previous VBAC History of C-section GBS bacteriuria  Past Medical History: Past Medical History:  Diagnosis Date   Medical history non-contributory     Past Surgical History: Past Surgical History:  Procedure Laterality Date   CESAREAN SECTION      Obstetrical History: OB History     Gravida  3   Para  3   Term  3   Preterm      AB      Living  3      SAB      IAB      Ectopic      Multiple  0   Live Births  3           Social History Social History   Socioeconomic History   Marital status: Single    Spouse name: Thornell Mule   Number of children: 3   Years of education: Not on file   Highest education level: Not on file  Occupational History   Not on file  Tobacco Use   Smoking status: Never   Smokeless tobacco: Never  Vaping Use   Vaping Use: Never used  Substance and Sexual Activity   Alcohol use: No   Drug use: No   Sexual activity: Yes    Birth control/protection: Implant  Other Topics Concern   Not on file  Social History Narrative   Not on file   Social Determinants of Health   Financial Resource Strain: Not on file  Food Insecurity: Not on file  Transportation Needs: Not on file  Physical Activity: Not on file  Stress: Not on file  Social Connections: Not on file    Family History: Family History  Problem Relation Age of Onset   Diabetes Mother     Diabetes Father     Allergies: No Known Allergies  Medications Prior to Admission  Medication Sig Dispense Refill Last Dose   Prenatal Vit-Fe Fumarate-FA (PRENATAL VITAMIN PO) Take by mouth.   09/21/2022   cephALEXin (KEFLEX) 500 MG capsule Take 500 mg by mouth at bedtime.        Review of Systems   All systems reviewed and negative except as stated in HPI  Blood pressure (P) 101/70, pulse (P) 69, temperature (P) 98.1 F (36.7 C), resp. rate (P) 17, height 5\' 2"  (1.575 m), weight 83 kg, last menstrual period 09/30/2021, SpO2 98 %, unknown if currently breastfeeding. General appearance: alert, cooperative, and appears stated age, in pain with contractions. Lungs: clear to auscultation bilaterally Heart: regular rate and rhythm Abdomen: soft, non-tender; bowel sounds normal Pelvic: see below Extremities: Homans sign is negative, no sign of DVT  Presentation: cephalic  Fetal monitoring: Cat 1  Uterine activity: every 2-3 mins.  Dilation: 10 Effacement (%): 90 Station: -2 Exam by:: Janann August RN   Prenatal labs: ABO, Rh: --/--/B POS (10/26 2250) Antibody: NEG (10/26 2250) Rubella: Immune (  05/01 0000) RPR: Nonreactive (05/01 0000)  HBsAg: Negative (05/01 0000)  HIV: Non-reactive (08/18 0000)  GBS: Positive/-- (09/29 0000)  1 hr Glucola normal Genetic screening  low risk Anatomy US normal  Prenatal Transfer Tool  Maternal Diabetes: No Genetic Screening: Normal Maternal Ultrasounds/Referrals: Normal Fetal Ultrasounds or other Referrals:  None Maternal Substance Abuse:  No Significant Maternal Medications:  None Significant Maternal Lab Results:  GBS bacteriuria Number of Prenatal Visits:greater than 3 verified prenatal visits Other Comments:  None  Results for orders placed or performed during the hospital encounter of 09/22/22 (from the past 24 hour(s))  CBC   Collection Time: 09/22/22 10:50 PM  Result Value Ref Range   WBC 8.3 4.0 - 10.5 K/uL   RBC  4.49 3.87 - 5.11 MIL/uL   Hemoglobin 12.5 12.0 - 15.0 g/dL   HCT 38.3 36.0 - 46.0 %   MCV 85.3 80.0 - 100.0 fL   MCH 27.8 26.0 - 34.0 pg   MCHC 32.6 30.0 - 36.0 g/dL   RDW 14.4 11.5 - 15.5 %   Platelets 292 150 - 400 K/uL   nRBC 0.0 0.0 - 0.2 %  Type and screen [place]   Collection Time: 09/22/22 10:50 PM  Result Value Ref Range   ABO/RH(D) B POS    Antibody Screen NEG    Sample Expiration      09/25/2022,2359 Performed at [facility], [place] [address], [place], [place] [address]     Patient Active Problem List   Diagnosis Date Noted   Alteration in comfort associated with uterine contractions 09/22/2022   VBAC, delivered 01/04/2018   Polyhydramnios 01/03/2018   Pyelonephritis affecting pregnancy in third trimester 01/01/2018   Pyelonephritis complicating pregnancy [id number]   History of cesarean delivery affecting pregnancy 12/01/2017   Language barrier 12/01/2017   Echogenic intracardiac focus of fetus on prenatal ultrasound 10/05/2017    Assessment/Plan:  Diane Carroll is a 26 y.o. G3P2002 at [redacted]w[redacted]d here for spontaneous onset of labor. Has had 1 prior C-section, followed by a successful VBAC. Discussed TOLAC with patient and got signed consent for this.  #Labor: Admitted to L&D for labor management.  #Pain: Desires epidural #FWB: Cat 1 #ID:  GBS positive - ampicillin #MOF: both #MOC:nexplanon #Circ:  no  Liliane Channel MD MPH OB Fellow, [place] for [facility] 09/23/2022

## 2022-09-22 NOTE — Anesthesia Procedure Notes (Signed)
Epidural Patient location during procedure: OB Start time: 09/22/2022 11:19 PM End time: 09/22/2022 11:27 PM  Staffing Anesthesiologist: Pervis Hocking, DO Performed: anesthesiologist   Preanesthetic Checklist Completed: patient identified, IV checked, risks and benefits discussed, monitors and equipment checked, pre-op evaluation and timeout performed  Epidural Patient position: sitting Prep: DuraPrep and site prepped and draped Patient monitoring: continuous pulse ox, blood pressure, heart rate and cardiac monitor Approach: midline Location: L3-L4 Injection technique: LOR air  Needle:  Needle type: Tuohy  Needle gauge: 17 G Needle length: 9 cm Needle insertion depth: 5.5 cm Catheter type: closed end flexible Catheter size: 19 Gauge Catheter at skin depth: 11 cm Test dose: negative  Assessment Sensory level: T8 Events: blood not aspirated, injection not painful, no injection resistance, no paresthesia and negative IV test  Additional Notes Patient identified. Risks/Benefits/Options discussed with patient including but not limited to bleeding, infection, nerve damage, paralysis, failed block, incomplete pain control, headache, blood pressure changes, nausea, vomiting, reactions to medication both or allergic, itching and postpartum back pain. Confirmed with bedside nurse the patient's most recent platelet count. Confirmed with patient that they are not currently taking any anticoagulation, have any bleeding history or any family history of bleeding disorders. Patient expressed understanding and wished to proceed. All questions were answered. Sterile technique was used throughout the entire procedure. Please see nursing notes for vital signs. Test dose was given through epidural catheter and negative prior to continuing to dose epidural or start infusion. Warning signs of high block given to the patient including shortness of breath, tingling/numbness in hands, complete motor  block, or any concerning symptoms with instructions to call for help. Patient was given instructions on fall risk and not to get out of bed. All questions and concerns addressed with instructions to call with any issues or inadequate analgesia.  Reason for block:procedure for pain

## 2022-09-22 NOTE — MAU Note (Signed)
..  Diane Carroll is a 26 y.o. at [redacted]w[redacted]d here in MAU reporting: Report she contractions that began this evening and then started having lower abdominal and lower back pain that began today.  Vaginal bleeding that began around an hour ago, when she wipes the tissue has pink and small red blood clots. Denies leaking of fluid. +FM  Pain score: 9/10 Vitals:   09/22/22 2212  BP: 111/76  Pulse: 77  Resp: 15  Temp: 98.1 F (36.7 C)  SpO2: 100%     FHT:136

## 2022-09-22 NOTE — Anesthesia Preprocedure Evaluation (Signed)
Anesthesia Evaluation  Patient identified by MRN, date of birth, ID band Patient awake    Reviewed: Allergy & Precautions, Patient's Chart, lab work & pertinent test results  Airway Mallampati: II  TM Distance: >3 FB Neck ROM: Full    Dental no notable dental hx.    Pulmonary neg pulmonary ROS,    Pulmonary exam normal breath sounds clear to auscultation       Cardiovascular negative cardio ROS Normal cardiovascular exam Rhythm:Regular Rate:Normal     Neuro/Psych negative neurological ROS  negative psych ROS   GI/Hepatic negative GI ROS, Neg liver ROS,   Endo/Other  BMI 33  Renal/GU negative Renal ROS  negative genitourinary   Musculoskeletal negative musculoskeletal ROS (+)   Abdominal   Peds negative pediatric ROS (+)  Hematology negative hematology ROS (+) Hb 12.5, plt 292   Anesthesia Other Findings   Reproductive/Obstetrics (+) Pregnancy                             Anesthesia Physical Anesthesia Plan  ASA: 2  Anesthesia Plan: Epidural   Post-op Pain Management:    Induction:   PONV Risk Score and Plan: 2  Airway Management Planned: Natural Airway  Additional Equipment: None  Intra-op Plan:   Post-operative Plan:   Informed Consent: I have reviewed the patients History and Physical, chart, labs and discussed the procedure including the risks, benefits and alternatives for the proposed anesthesia with the patient or authorized representative who has indicated his/her understanding and acceptance.       Plan Discussed with:   Anesthesia Plan Comments:         Anesthesia Quick Evaluation

## 2022-09-23 ENCOUNTER — Encounter (HOSPITAL_COMMUNITY): Payer: Self-pay | Admitting: Family Medicine

## 2022-09-23 DIAGNOSIS — Z3A40 40 weeks gestation of pregnancy: Secondary | ICD-10-CM

## 2022-09-23 DIAGNOSIS — O34211 Maternal care for low transverse scar from previous cesarean delivery: Secondary | ICD-10-CM

## 2022-09-23 DIAGNOSIS — O34219 Maternal care for unspecified type scar from previous cesarean delivery: Secondary | ICD-10-CM

## 2022-09-23 DIAGNOSIS — O99824 Streptococcus B carrier state complicating childbirth: Secondary | ICD-10-CM

## 2022-09-23 LAB — CBC
HCT: 31.4 % — ABNORMAL LOW (ref 36.0–46.0)
Hemoglobin: 10.5 g/dL — ABNORMAL LOW (ref 12.0–15.0)
MCH: 28.5 pg (ref 26.0–34.0)
MCHC: 33.4 g/dL (ref 30.0–36.0)
MCV: 85.3 fL (ref 80.0–100.0)
Platelets: 243 10*3/uL (ref 150–400)
RBC: 3.68 MIL/uL — ABNORMAL LOW (ref 3.87–5.11)
RDW: 14.3 % (ref 11.5–15.5)
WBC: 16.8 10*3/uL — ABNORMAL HIGH (ref 4.0–10.5)
nRBC: 0 % (ref 0.0–0.2)

## 2022-09-23 LAB — TYPE AND SCREEN
ABO/RH(D): B POS
Antibody Screen: NEGATIVE

## 2022-09-23 LAB — RPR: RPR Ser Ql: NONREACTIVE

## 2022-09-23 MED ORDER — BENZOCAINE-MENTHOL 20-0.5 % EX AERO: 1.0000 | INHALATION_SPRAY | CUTANEOUS | Status: DC | PRN

## 2022-09-23 MED ORDER — SENNOSIDES-DOCUSATE SODIUM 8.6-50 MG PO TABS
2.0000 | ORAL_TABLET | ORAL | Status: DC
  Administered 2022-09-23 – 2022-09-24 (×2): 2 via ORAL
  Filled 2022-09-23: qty 2

## 2022-09-23 MED ORDER — ONDANSETRON HCL 4 MG PO TABS: 4.0000 mg | ORAL_TABLET | ORAL | Status: DC | PRN

## 2022-09-23 MED ORDER — ZOLPIDEM TARTRATE 5 MG PO TABS: 5.0000 mg | ORAL_TABLET | Freq: Every evening | ORAL | Status: DC | PRN

## 2022-09-23 MED ORDER — DIBUCAINE (PERIANAL) 1 % EX OINT: 1.0000 | TOPICAL_OINTMENT | CUTANEOUS | Status: DC | PRN

## 2022-09-23 MED ORDER — PRENATAL MULTIVITAMIN CH
1.0000 | ORAL_TABLET | Freq: Every day | ORAL | Status: DC
  Administered 2022-09-23 – 2022-09-24 (×2): 1 via ORAL
  Filled 2022-09-23: qty 1

## 2022-09-23 MED ORDER — SODIUM CHLORIDE 0.9 % IV SOLN: INTRAVENOUS | Status: DC | PRN

## 2022-09-23 MED ORDER — COCONUT OIL OIL: 1.0000 | TOPICAL_OIL | Status: DC | PRN

## 2022-09-23 MED ORDER — DIPHENHYDRAMINE HCL 25 MG PO CAPS: 25.0000 mg | ORAL_CAPSULE | Freq: Four times a day (QID) | ORAL | Status: DC | PRN

## 2022-09-23 MED ORDER — IBUPROFEN 600 MG PO TABS
600.0000 mg | ORAL_TABLET | Freq: Four times a day (QID) | ORAL | Status: DC
  Administered 2022-09-23 – 2022-09-24 (×7): 600 mg via ORAL
  Filled 2022-09-23 (×5): qty 1

## 2022-09-23 MED ORDER — TRANEXAMIC ACID-NACL 1000-0.7 MG/100ML-% IV SOLN
INTRAVENOUS | Status: AC
  Administered 2022-09-23: 1000 mg via INTRAVENOUS
  Filled 2022-09-23: qty 100

## 2022-09-23 MED ORDER — OXYCODONE HCL 5 MG PO TABS: 5.0000 mg | ORAL_TABLET | ORAL | Status: DC | PRN

## 2022-09-23 MED ORDER — SODIUM CHLORIDE 0.9% FLUSH: 3.0000 mL | INTRAVENOUS | Status: DC | PRN

## 2022-09-23 MED ORDER — SIMETHICONE 80 MG PO CHEW: 80.0000 mg | CHEWABLE_TABLET | ORAL | Status: DC | PRN

## 2022-09-23 MED ORDER — WITCH HAZEL-GLYCERIN EX PADS: 1.0000 | MEDICATED_PAD | CUTANEOUS | Status: DC | PRN

## 2022-09-23 MED ORDER — TETANUS-DIPHTH-ACELL PERTUSSIS 5-2.5-18.5 LF-MCG/0.5 IM SUSY: 0.5000 mL | PREFILLED_SYRINGE | Freq: Once | INTRAMUSCULAR | Status: DC

## 2022-09-23 MED ORDER — MEASLES, MUMPS & RUBELLA VAC IJ SOLR: 0.5000 mL | Freq: Once | INTRAMUSCULAR | Status: DC

## 2022-09-23 MED ORDER — SODIUM CHLORIDE 0.9% FLUSH: 3.0000 mL | Freq: Two times a day (BID) | INTRAVENOUS | Status: DC

## 2022-09-23 MED ORDER — ACETAMINOPHEN 325 MG PO TABS: 650.0000 mg | ORAL_TABLET | ORAL | Status: DC | PRN

## 2022-09-23 MED ORDER — ONDANSETRON HCL 4 MG/2ML IJ SOLN: 4.0000 mg | INTRAMUSCULAR | Status: DC | PRN

## 2022-09-23 MED ORDER — OXYCODONE HCL 5 MG PO TABS: 10.0000 mg | ORAL_TABLET | ORAL | Status: DC | PRN

## 2022-09-23 MED ORDER — TRANEXAMIC ACID-NACL 1000-0.7 MG/100ML-% IV SOLN: 1000.0000 mg | Freq: Once | INTRAVENOUS | Status: AC

## 2022-09-23 NOTE — Anesthesia Postprocedure Evaluation (Signed)
Anesthesia Post Note  Patient: Diane Carroll  Procedure(s) Performed: AN AD [facility]     Patient location during evaluation: Mother Baby Anesthesia Type: Epidural Level of consciousness: awake Pain management: satisfactory to patient Vital Signs Assessment: post-procedure vital signs reviewed and stable Respiratory status: spontaneous breathing Cardiovascular status: stable Anesthetic complications: no   No notable events documented.  Last Vitals:  Vitals:   09/23/22 0742 09/23/22 1230  BP: 104/70   Pulse: 80 (P) 81  Resp: 18   Temp: 36.9 C (P) 36.9 C  SpO2: 98% (P) 98%    Last Pain:  Vitals:   09/23/22 1235  TempSrc:   PainSc: 0-No pain   Pain Goal: Patients Stated Pain Goal: 0 (09/22/22 2214)                 Casimer Lanius

## 2022-09-23 NOTE — Discharge Summary (Signed)
Postpartum Discharge Summary  Date of Service updated***     Patient Name: Diane Carroll DOB: [birth year] MRN: [id number]  Date of admission: 09/22/2022 Delivery date:09/23/2022  Delivering provider: Stormy Card  Date of discharge: 09/23/2022  Admitting diagnosis: Alteration in comfort associated with uterine contractions [N85.8] Intrauterine pregnancy: [redacted]w[redacted]d    Secondary diagnosis:  Principal Problem:   Alteration in comfort associated with uterine contractions Active Problems:   History of cesarean delivery affecting pregnancy   VBAC, delivered, current hospitalization  Additional problems: ***    Discharge diagnosis: Term Pregnancy Delivered and VBAC                                              Post partum procedures:{Postpartum procedures:23558} Augmentation: N/A Complications: None  Hospital course: Onset of Labor With Vaginal Delivery      26y.o. yo G3P3003 at 437w0das admitted in Active Labor on 09/22/2022. Labor course was uncomplicated.  Membrane Rupture Time/Date: 12:00 AM ,09/23/2022   Delivery Method:VBAC, Spontaneous  Episiotomy: None  Lacerations:  None  Patient had a postpartum course complicated by ***.  She is ambulating, tolerating a regular diet, passing flatus, and urinating well. Patient is discharged home in stable condition on 09/23/22.  Newborn Data: Birth date:09/23/2022  Birth time:1:05 AM  Gender:Female  Living status:Living  Apgars:8 ,9  Weight:3997 g   Magnesium Sulfate received: No BMZ received: No Rhophylac:N/A MMR:{MMR:30440033} T-DaP:{Tdap:23962} Flu: {F{TGY:56389}ransfusion:{Transfusion received:30440034}  Physical exam  Vitals:   09/23/22 0215 09/23/22 0230 09/23/22 0245 09/23/22 0307  BP: 101/64 97/65 (!) 89/57 (P) 101/70  Pulse: 77 77 76 (P) 69  Resp:    (P) 17  Temp:    (P) 98.1 F (36.7 C)  TempSrc:      SpO2:      Weight:      Height:       General: {Exam; general:21111117} Lochia: {Desc;  appropriate/inappropriate:30686::"appropriate"} Uterine Fundus: {Desc; firm/soft:30687} Incision: {Exam; incision:21111123} DVT Evaluation: {Exam; dvt:2111122} Labs: Lab Results  Component Value Date   WBC 8.3 09/22/2022   HGB 12.5 09/22/2022   HCT 38.3 09/22/2022   MCV 85.3 09/22/2022   PLT 292 09/22/2022      Latest Ref Rng & Units 11/08/2021   10:45 PM  CMP  Glucose 70 - 99 mg/dL 103   BUN 6 - 20 mg/dL 8   Creatinine 0.44 - 1.00 mg/dL 0.60   Sodium 135 - 145 mmol/L 138   Potassium 3.5 - 5.1 mmol/L 3.4   Chloride 98 - 111 mmol/L 106   CO2 22 - 32 mmol/L 26   Calcium 8.9 - 10.3 mg/dL 8.7   Total Protein 6.5 - 8.1 g/dL 6.9   Total Bilirubin 0.3 - 1.2 mg/dL 0.4   Alkaline Phos 38 - 126 U/L 76   AST 15 - 41 U/L 18   ALT 0 - 44 U/L 15    Edinburgh Score:    01/05/2018    2:40 PM  Edinburgh Postnatal Depression Scale Screening Tool  I have been able to laugh and see the funny side of things. 0  I have looked forward with enjoyment to things. 3  I have blamed myself unnecessarily when things went wrong. 0  I have been anxious or worried for no good reason. 0  I have felt scared or panicky for no good reason. 0  Things have been getting on top of me. 1  I have been so unhappy that I have had difficulty sleeping. 0  I have felt sad or miserable. 0  I have been so unhappy that I have been crying. 0  The thought of harming myself has occurred to me. 0  Edinburgh Postnatal Depression Scale Total 4     After visit meds:  Allergies as of 09/23/2022   No Known Allergies   Med Rec must be completed prior to using this [facility]***        Discharge home in stable condition Infant Feeding: {Baby feeding:23562} Infant Disposition:{CHL IP OB HOME WITH VVOHYW:73710} Discharge instruction: per After Visit Summary and Postpartum booklet. Activity: Advance as tolerated. Pelvic rest for 6 weeks.  Diet: {OB GYIR:48546270} Future Appointments:No future appointments. Follow  up Visit:: with [facility] in 4-6 weeks for post partum visit.   09/23/2022 Chiagoziem Sherrilyn Rist, MD

## 2022-09-23 NOTE — Lactation Note (Signed)
This note was copied from a baby's chart. Lactation Consultation Note  Patient Name: Diane Carroll CWCBJ'S Date: 09/23/2022 Age : 28 hours  Reason for consult: Initial assessment;Term;Infant weight loss;Breastfeeding assistance (1% weight loss, baby awake,rooting, LC offered to assist to latch. Diaper dry. Baby Latched easily on the left Br , with swallows and per mom comfortable. Discussed Supply and demand.  Unable to contact [facility] Interpreter - used the online - [phone] Stann [place])  Maternal Data Has patient been taught Hand Expression?: Yes Does the patient have breastfeeding experience prior to this delivery?: Yes How long did the patient breastfeed?: per mom BF 1st 2 - 2 weeks due to her milk not coming in well  Feeding Mother's Current Feeding Choice: Breast Milk and Formula  LATCH Score Latch: Grasps breast easily, tongue down, lips flanged, rhythmical sucking.  Audible Swallowing: Spontaneous and intermittent  Type of Nipple: Everted at rest and after stimulation  Comfort (Breast/Nipple): Filling, red/small blisters or bruises, mild/mod discomfort (Per mom, no pain)  Hold (Positioning): Assistance needed to correctly position infant at breast and maintain latch.  LATCH Score: 8   Lactation Tools Discussed/Used    Interventions Interventions: Assisted with latch;Breast feeding basics reviewed;Skin to skin;Hand express;Breast compression;Adjust position;Support pillows;Education  Discharge    Consult Status Consult Status: Follow-up Date: 09/23/22 Follow-up type: In-patient    [place] 09/23/2022, 10:09 AM

## 2022-09-24 MED ORDER — IBUPROFEN 600 MG PO TABS: 600.0000 mg | ORAL_TABLET | Freq: Four times a day (QID) | ORAL | 0 refills | Status: DC

## 2022-09-24 MED ORDER — INFLUENZA VAC SPLIT QUAD 0.5 ML IM SUSY
0.5000 mL | PREFILLED_SYRINGE | INTRAMUSCULAR | Status: AC | PRN
  Administered 2022-09-24: 0.5 mL via INTRAMUSCULAR
  Filled 2022-09-24: qty 0.5

## 2022-09-24 NOTE — Progress Notes (Signed)
Ordered pt meals by Bret Stamour Spanish Medical Interpreter. 

## 2022-10-04 ENCOUNTER — Telehealth (HOSPITAL_COMMUNITY): Payer: Self-pay | Admitting: *Deleted

## 2022-10-04 NOTE — Telephone Encounter (Signed)
Attempted hospital discharge follow-up call with Language Line interpreter, [place] [phone]. Call was dropped. Second call attempted with Language [employer] interpreter, Luanna Salk [phone]. No answer received. Erline Levine, RN, 10/04/22, [phone]

## 2023-12-14 ENCOUNTER — Ambulatory Visit: Payer: Medicaid Other | Admitting: Physician Assistant

## 2023-12-14 ENCOUNTER — Encounter: Payer: Self-pay | Admitting: Physician Assistant

## 2023-12-14 ENCOUNTER — Other Ambulatory Visit: Payer: Medicaid Other

## 2023-12-14 VITALS — BP 100/80 | HR 88 | Ht 62.0 in | Wt 152.5 lb

## 2023-12-14 DIAGNOSIS — K921 Melena: Secondary | ICD-10-CM | POA: Diagnosis not present

## 2023-12-14 DIAGNOSIS — K219 Gastro-esophageal reflux disease without esophagitis: Secondary | ICD-10-CM | POA: Diagnosis not present

## 2023-12-14 DIAGNOSIS — R1011 Right upper quadrant pain: Secondary | ICD-10-CM

## 2023-12-14 DIAGNOSIS — R110 Nausea: Secondary | ICD-10-CM

## 2023-12-14 LAB — COMPREHENSIVE METABOLIC PANEL
ALT: 21 U/L (ref 0–35)
AST: 21 U/L (ref 0–37)
Albumin: 4.5 g/dL (ref 3.5–5.2)
Alkaline Phosphatase: 68 U/L (ref 39–117)
BUN: 12 mg/dL (ref 6–23)
CO2: 27 meq/L (ref 19–32)
Calcium: 9.1 mg/dL (ref 8.4–10.5)
Chloride: 103 meq/L (ref 96–112)
Creatinine, Ser: 0.56 mg/dL (ref 0.40–1.20)
GFR: 123.43 mL/min (ref >60.00)
Glucose, Bld: 116 mg/dL — ABNORMAL HIGH (ref 70–99)
Potassium: 3.7 meq/L (ref 3.5–5.1)
Sodium: 137 meq/L (ref 135–145)
Total Bilirubin: 0.3 mg/dL (ref 0.2–1.2)
Total Protein: 7.4 g/dL (ref 6.0–8.3)

## 2023-12-14 LAB — CBC WITH DIFFERENTIAL/PLATELET
Basophils Absolute: 0 10*3/uL (ref 0.0–0.1)
Basophils Relative: 0.4 % (ref 0.0–3.0)
Eosinophils Absolute: 0.1 10*3/uL (ref 0.0–0.7)
Eosinophils Relative: 1.3 % (ref 0.0–5.0)
HCT: 37 % (ref 36.0–46.0)
Hemoglobin: 12.2 g/dL (ref 12.0–15.0)
Lymphocytes Relative: 17.5 % (ref 12.0–46.0)
Lymphs Abs: 1.3 10*3/uL (ref 0.7–4.0)
MCHC: 33.1 g/dL (ref 30.0–36.0)
MCV: 86.6 fL (ref 78.0–100.0)
Monocytes Absolute: 0.6 10*3/uL (ref 0.1–1.0)
Monocytes Relative: 7.9 % (ref 3.0–12.0)
Neutro Abs: 5.5 10*3/uL (ref 1.4–7.7)
Neutrophils Relative %: 72.9 % (ref 43.0–77.0)
Platelets: 321 10*3/uL (ref 150.0–400.0)
RBC: 4.27 Mil/uL (ref 3.87–5.11)
RDW: 13.2 % (ref 11.5–15.5)
WBC: 7.6 10*3/uL (ref 4.0–10.5)

## 2023-12-14 LAB — LIPASE: Lipase: 36 U/L (ref 11.0–59.0)

## 2023-12-14 MED ORDER — DICYCLOMINE HCL 20 MG PO TABS: 20.0000 mg | ORAL_TABLET | Freq: Three times a day (TID) | ORAL | 0 refills | Status: DC | PRN

## 2023-12-14 MED ORDER — PANTOPRAZOLE SODIUM 40 MG PO TBEC: 40.0000 mg | DELAYED_RELEASE_TABLET | Freq: Every day | ORAL | 3 refills | Status: AC

## 2023-12-14 NOTE — Patient Instructions (Addendum)
Su proveedor le ha solicitado que vaya al stano para Education officer, environmental anlisis de laboratorio antes de irse hoy. Presione "B" en el ascensor. El laboratorio est ubicado en la primera puerta a la izquierda al salir del ascensor  We have sent the following medications to your pharmacy for you to pick up at your convenience:  Bentyl, Pantoprazole  You have been scheduled for an abdominal ultrasound at [facility] (1st floor of hospital) on 12/25/2023 at 10:00am. Please arrive 15 minutes prior to your appointment for registration. Make certain not to have anything to eat or drink 6 hours prior to your appointment. Should you need to reschedule your appointment, please contact radiology at [phone]. This test typically takes about 30 minutes to perform.    Tome su medicamento inhibidor de la bomba de protones, pantoprazol 40 mg una vez al [employer].  Tome [employer] entre 30 minutos y 1 hora antes de las comidas; esto lo hace ms eficaz.  DEJAR EL IBUPROFENO, puedas tomas dicyclomine para dolar Evite los alimentos picantes y cidos. Evite los alimentos grasos Limite su consumo de caf, t, alcohol y bebidas carbonatadas. Trabaja para Pharmacologist un peso saludable Mantenga la cabecera de la cama elevada al menos 3 pulgadas con bloques o una almohada de cua si tiene algn sntoma nocturno. Mantngase erguido durante 2 horas despus de comer. Evite comidas y refrigerios tres o cuatro horas antes de [place].  Colecistitis Cholecystitis La colecistitis es la irritacin e hinchazn (inflamacin) de la vescula biliar. La vescula biliar: Es un rgano que tiene forma de [place]. Se encuentra debajo del hgado, del lado derecho del cuerpo. Almacena la bilis. La bilis ayuda al cuerpo a descomponer Engineer, drilling) las grasas de los alimentos. Esta afeccin puede presentarse de manera repentina. Es necesario tratarla. Cules son las causas? Esta afeccin puede ser causada por las piedras o bultos que se  forman en la vescula biliar (clculos biliares). Los clculos biliares pueden obstruir los conductos (vas biliares) que transportan la bilis desde la vescula biliar. Otras causas son: Gardiner Ramus a la vescula biliar debido a una disminucin del flujo sanguneo. Grmenes en las vas biliares. Cicatrices, obstrucciones o adherencias en las vas biliares. Crecimientos anormales (tumores) en el hgado, el pncreas o la vescula biliar. Qu incrementa el riesgo? Es ms probable que sufra esta afeccin si: Es mujer y tiene entre 55 y 65 aos. Toma pldoras anticonceptivas. [place] estrgeno. Toma ciertos medicamentos que lo hacen ms propenso a desarrollar clculos biliares. Tiene sobrepeso (es obeso). Tiene una reaccin muy grave a una infeccin (sepsis). Ha sido hospitalizado por un problema grave, como una quemadura o una enfermedad. No ha comido ni bebido Theatre manager. Cules son los signos o sntomas? Los sntomas de esta afeccin incluyen: Dolor en la parte superior derecha del vientre (abdomen). Bulto sobre la vescula biliar. Distensin en el abdomen. Ganas de vomitar (nuseas). Vmitos. Grant Ruts. Escalofros. Cmo se trata? El tratamiento de esta afeccin puede incluir: Medicamentos para Chief Technology Officer. La administracin de lquidos a travs de un tubo (catter) intravenoso. No comer ni beber nada (hacer ayuno). Antibiticos. Ciruga para extirpar la vescula biliar. Drenaje de la vescula biliar. Siga estas instrucciones en su casa: Medicamentos  Use los medicamentos de venta libre y los recetados solamente como se lo haya indicado el mdico. Si le recetaron un antibitico, tmelo como se lo haya indicado el mdico. No deje de tomarlo aunque comience a sentirse mejor. Instrucciones generales Siga las instrucciones del mdico respecto de las comidas o las bebidas.  No coma ni beba nada que le cause malestar nuevamente. No fume ni consuma ningn producto que contenga  nicotina o tabaco. Si necesita ayuda para dejar de fumar, consulte al mdico. Concurra a todas las visitas de seguimiento. Comunquese con un mdico si: Electronics engineer y los medicamentos no [address]. Tiene fiebre. Solicite ayuda de inmediato si: El dolor se desplaza a los siguientes lugares: Otra parte del vientre. La espalda. Los sntomas no desaparecen. Aparecen nuevos sntomas. Estos sntomas pueden Customer service manager. Solicite ayuda de inmediato. Llame al 911. No espere a ver si los sntomas desaparecen. No conduzca por sus propios medios [employer] hospital. Resumen Esta afeccin puede ser causada por las piedras o bultos que se forman en la vescula biliar (clculos biliares). Un sntoma frecuente es el dolor en el vientre. Esta afeccin puede tratarse con ciruga para extirpar la vescula biliar. Siga las instrucciones del mdico respecto de las comidas o las bebidas. Esta informacin no tiene Theme park manager el consejo del mdico. Asegrese de hacerle al mdico cualquier pregunta que tenga. Document Revised: 06/22/2021 Document Reviewed: 06/22/2021 Elsevier Patient Education  2024 [employer].

## 2023-12-14 NOTE — Progress Notes (Signed)
12/14/2023 Diane Carroll [id number] February 14, 1996  Referring provider: No ref. provider found Primary GI doctor: Dr. Myrtie Neither  ASSESSMENT AND PLAN:   RUQ/epigastric pain with nausea, worse with fatty foods, radiation to her back 10/2021 cholethiasis Mom with history of GB removal -Order labs to assess liver function and look for infection. -Order repeat ultrasound of gallbladder. - check lipase to rule out GB pancreatitis  Melena, GERD  In setting of ibuprofen use, up to 6 pills a day No ETOH -Discontinue ibuprofen due to risk of ulcers. -Start PPI for risk of PUD -Start dicyclomine -Plan for endoscopy to evaluate for H. pylori infection and ulcers.  I discussed risks of EGD with patient today, including risk of sedation, bleeding or perforation.  Patient provides understanding and gave verbal consent to proceed.  *Due to language barrier, an interpreter, Jhon [phone] with was present during the history-taking and subsequent discussion (and for part of the physical exam) with this patient.   Patient Care Team: Patient, No Pcp Per as PCP - General (General Practice)  HISTORY OF PRESENT ILLNESS: 28 y.o. Spanish-speaking female presents for evaluation of RUQ pain.   11/18/2021 right upper quadrant abdominal ultrasound the ER for discomfort showed cholelithiasis without definite acute cholecystitis normal CBD negative Murphy's.  Discussed the use of AI scribe software for clinical note transcription with the patient, who gave verbal consent to proceed.  History of Present Illness   The patient, with a history of chronic abdominal pain for at least two years, presents with worsening symptoms. The pain is located in the upper abdomen and radiates to the back. It is described as a constant, dull ache that occasionally intensifies. The pain is exacerbated by the consumption of fatty foods. Accompanying symptoms include nausea and occasional black stools. The patient denies any  changes in bowel habits, fever, chills, heartburn, reflux, or difficulty swallowing. To manage the pain, the patient has been self-medicating with ibuprofen, taking up to six 250mg  tablets daily. The patient denies alcohol use. There is a family history of gallbladder issues.      She  reports that she has never smoked. She has never used smokeless tobacco. She reports that she does not drink alcohol and does not use drugs.  RELEVANT GI HISTORY, LABS, IMAGING:  CBC    Component Value Date/Time   WBC 16.8 (H) 09/23/2022 0450   RBC 3.68 (L) 09/23/2022 0450   HGB 10.5 (L) 09/23/2022 0450   HCT 31.4 (L) 09/23/2022 0450   PLT 243 09/23/2022 0450   MCV 85.3 09/23/2022 0450   MCH 28.5 09/23/2022 0450   MCHC 33.4 09/23/2022 0450   RDW 14.3 09/23/2022 0450   LYMPHSABS 2.1 11/08/2021 2245   MONOABS 0.6 11/08/2021 2245   EOSABS 0.2 11/08/2021 2245   BASOSABS 0.0 11/08/2021 2245   No results for input(s): "HGB" in the last 8760 hours.  CMP     Component Value Date/Time   NA 138 11/08/2021 2245   K 3.4 (L) 11/08/2021 2245   CL 106 11/08/2021 2245   CO2 26 11/08/2021 2245   GLUCOSE 103 (H) 11/08/2021 2245   BUN 8 11/08/2021 2245   CREATININE 0.60 11/08/2021 2245   CALCIUM 8.7 (L) 11/08/2021 2245   PROT 6.9 11/08/2021 2245   ALBUMIN 3.7 11/08/2021 2245   AST 18 11/08/2021 2245   ALT 15 11/08/2021 2245   ALKPHOS 76 11/08/2021 2245   BILITOT 0.4 11/08/2021 2245   GFRNONAA >60 11/08/2021 2245   GFRAA >60 01/01/2018  0021      Latest Ref Rng & Units 11/08/2021   10:45 PM 01/01/2018   12:21 AM  Hepatic Function  Total Protein 6.5 - 8.1 g/dL 6.9  6.8   Albumin 3.5 - 5.0 g/dL 3.7  2.7   AST 15 - 41 U/L 18  55   ALT 0 - 44 U/L 15  52   Alk Phosphatase 38 - 126 U/L 76  290   Total Bilirubin 0.3 - 1.2 mg/dL 0.4  1.3       Current Medications:      Current Outpatient Medications (Analgesics):    ibuprofen (ADVIL) 600 MG tablet, Take 1 tablet (600 mg total) by mouth every 6 (six)  hours.   Current Outpatient Medications (Other):    dicyclomine (BENTYL) 20 MG tablet, Take 1 tablet (20 mg total) by mouth 3 (three) times daily as needed for spasms.   pantoprazole (PROTONIX) 40 MG tablet, Take 1 tablet (40 mg total) by mouth daily.  Medical History:  Past Medical History:  Diagnosis Date   Medical history non-contributory    Allergies: No Known Allergies   Surgical History:  She  has a past surgical history that includes Cesarean section. Family History:  Her family history includes Diabetes in her father and mother.  REVIEW OF SYSTEMS  : All other systems reviewed and negative except where noted in the History of Present Illness.  PHYSICAL EXAM: BP 100/80   Pulse 88   Ht 5\' 2"  (1.575 m)   Wt 152 lb 8 oz (69.2 kg)   BMI 27.89 kg/m  General Appearance: Well nourished, in no apparent distress. Head:   Normocephalic and atraumatic. Eyes:  sclerae anicteric,conjunctive pink  Respiratory: Respiratory effort normal, BS equal bilaterally without rales, rhonchi, wheezing. Cardio: RRR with no MRGs. Peripheral pulses intact.  Abdomen: Soft,  Obese ,active bowel sounds. mild tenderness in the epigastrium and in the RUQ. Without guarding and Without rebound. Neg murphy, No masses. Rectal: Not evaluated Musculoskeletal: Full ROM, Normal gait. Without edema. Skin:  Dry and intact without significant lesions or rashes Neuro: Alert and  oriented x4;  No focal deficits. Psych:  Cooperative. Normal mood and affect.    Doree Albee, PA-C 2:39 PM

## 2023-12-15 LAB — IGA: Immunoglobulin A: 186 mg/dL (ref 47–310)

## 2023-12-15 LAB — TISSUE TRANSGLUTAMINASE, IGA: (tTG) Ab, IgA: <1 U/mL

## 2023-12-20 NOTE — Progress Notes (Signed)
____________________________________________________________  Attending physician addendum:  Thank you for sending this case to me. I have reviewed the entire note and agree with the plan.  Sounds like biliary colic  Amada Jupiter, MD  ____________________________________________________________

## 2023-12-25 ENCOUNTER — Ambulatory Visit (HOSPITAL_COMMUNITY)
Admission: RE | Admit: 2023-12-25 | Discharge: 2023-12-25 | Disposition: A | Discharge status: Home / Self Care | Payer: Medicaid Other | Source: Ambulatory Visit | Attending: Physician Assistant | Admitting: Physician Assistant

## 2023-12-25 DIAGNOSIS — R1011 Right upper quadrant pain: Secondary | ICD-10-CM | POA: Insufficient documentation

## 2023-12-25 DIAGNOSIS — K219 Gastro-esophageal reflux disease without esophagitis: Secondary | ICD-10-CM | POA: Insufficient documentation

## 2023-12-26 ENCOUNTER — Other Ambulatory Visit: Payer: Self-pay

## 2023-12-26 ENCOUNTER — Telehealth: Payer: Self-pay | Admitting: Physician Assistant

## 2023-12-26 DIAGNOSIS — K760 Fatty (change of) liver, not elsewhere classified: Secondary | ICD-10-CM

## 2023-12-26 NOTE — Telephone Encounter (Signed)
Pt aware of results and recommendations per Quentin Mulling PA. Referral sent to [facility] for possible gallbladder surgery. Lab orders and reminder in epic.

## 2023-12-26 NOTE — Telephone Encounter (Signed)
Inbound call from patient requesting a call to discuss  ultrasound results. Please advise.

## 2024-01-09 ENCOUNTER — Other Ambulatory Visit: Payer: Self-pay | Admitting: *Deleted

## 2024-01-09 MED ORDER — DICYCLOMINE HCL 20 MG PO TABS: 20.0000 mg | ORAL_TABLET | Freq: Three times a day (TID) | ORAL | 0 refills | Status: DC | PRN

## 2024-01-29 ENCOUNTER — Ambulatory Visit (AMBULATORY_SURGERY_CENTER): Payer: Medicaid Other | Admitting: Gastroenterology

## 2024-01-29 ENCOUNTER — Encounter: Payer: Self-pay | Admitting: Gastroenterology

## 2024-01-29 VITALS — BP 98/65 | HR 67 | Temp 98.6°F | Resp 15 | Ht 62.0 in | Wt 152.0 lb

## 2024-01-29 DIAGNOSIS — R12 Heartburn: Secondary | ICD-10-CM | POA: Diagnosis not present

## 2024-01-29 DIAGNOSIS — R1011 Right upper quadrant pain: Secondary | ICD-10-CM

## 2024-01-29 HISTORY — PX: UPPER GI ENDOSCOPY: SHX6162

## 2024-01-29 MED ORDER — SODIUM CHLORIDE 0.9 % IV SOLN: 500.0000 mL | Freq: Once | INTRAVENOUS | Status: DC

## 2024-01-29 NOTE — Op Note (Signed)
 [facility] Patient Name: Diane Carroll Procedure Date: 01/29/2024 9:55 AM MRN: [id number] Endoscopist: Sherilyn Cooter L. Myrtie Neither , MD, [id number] Age: 28 Referring MD:  Date of Birth: [birth year] Gender: Female Account #: [id number] Procedure:                Upper GI endoscopy Indications:              Abdominal pain in the right upper quadrant,                            Heartburn and black stool ( intermittently when                            using NSAIDs more frequently)                           see recent office note for clinical details Medicines:                Monitored Anesthesia Care Procedure:                Pre-Anesthesia Assessment:                           - Prior to the procedure, a History and Physical                            was performed, and patient medications and                            allergies were reviewed. The patient's tolerance of                            previous anesthesia was also reviewed. The risks                            and benefits of the procedure and the sedation                            options and risks were discussed with the patient.                            All questions were answered, and informed consent                            was obtained. Prior Anticoagulants: The patient has                            taken no anticoagulant or antiplatelet agents. ASA                            Grade Assessment: II - A patient with mild systemic                            disease. After reviewing the risks and benefits,  the patient was deemed in satisfactory condition to                            undergo the procedure.                           After obtaining informed consent, the endoscope was                            passed under direct vision. Throughout the                            procedure, the patient's blood pressure, pulse, and                            oxygen saturations were monitored  continuously. The                            Olympus Scope [id number] was introduced through the                            mouth, and advanced to the second part of duodenum.                            The upper GI endoscopy was accomplished without                            difficulty. The patient tolerated the procedure                            well. Scope In: Scope Out: Findings:                 The esophagus was normal.                           The stomach was normal.                           The cardia and gastric fundus were normal on                            retroflexion.                           The examined duodenum was normal. Complications:            No immediate complications. Estimated Blood Loss:     Estimated blood loss: none. Impression:               - Normal esophagus.                           - Normal stomach.                           - Normal examined duodenum.                           -  No specimens collected.                           No gastritis or ulcer seen. Pain most consistent                            with gallstones (which were seen on a recent                            ultrasound) Recommendation:           - Patient has a contact number available for                            emergencies. The signs and symptoms of potential                            delayed complications were discussed with the                            patient. Return to normal activities tomorrow.                            Written discharge instructions were provided to the                            patient.                           - Resume previous diet.                           - Minimize NSAID use.                           Send another referral to general surgery for                            biliary colic.(provider/practice that accepts                            Medicaid) Sherilyn Cooter L. Myrtie Neither, MD 01/29/2024 10:19:12 AM This report has been signed  electronically.

## 2024-01-29 NOTE — Progress Notes (Signed)
 History and Physical:  This patient presents for endoscopic testing for: Encounter Diagnosis  Name Primary?   RUQ pain Yes    28 yo woman seen in office 12/26/23 for RUQ pain. Symptoms sounded most likely biliary colic, and gallstones were found on a subsequent [place]. EGD today to investigate patient reports of heartburn and intermittent black stool in the setting of NSAID use.  Patient is otherwise without complaints or active issues today. (Spanish interpreter present )  Past Medical History: Past Medical History:  Diagnosis Date   Medical history non-contributory      Past Surgical History: Past Surgical History:  Procedure Laterality Date   CESAREAN SECTION      Allergies: No Known Allergies  Outpatient Meds: Current Outpatient Medications  Medication Sig Dispense Refill   dicyclomine (BENTYL) 20 MG tablet Take 1 tablet (20 mg total) by mouth 3 (three) times daily as needed for spasms. 50 tablet 0   pantoprazole (PROTONIX) 40 MG tablet Take 1 tablet (40 mg total) by mouth daily. 30 tablet 3   ibuprofen (ADVIL) 600 MG tablet Take 1 tablet (600 mg total) by mouth every 6 (six) hours. 30 tablet 0   Current Facility-Administered Medications  Medication Dose Route Frequency Provider Last Rate Last Admin   0.9 %  sodium chloride infusion  500 mL Intravenous Once Danis, Starr Lake III, MD          ___________________________________________________________________ Objective   Exam:  BP 98/70   Pulse 76   Temp 98.6 F (37 C) (Temporal)   Ht 5\' 2"  (1.575 m)   Wt 152 lb (68.9 kg)   LMP 01/26/2024   SpO2 100%   Breastfeeding No   BMI 27.80 kg/m   CV: regular , S1/S2 Resp: clear to auscultation bilaterally, normal RR and effort noted GI: soft, no tenderness, with active bowel sounds.   Assessment: Encounter Diagnosis  Name Primary?   RUQ pain Yes     Plan: EGD  The benefits and risks of the planned procedure(s) were described in detail with the patient or  (when appropriate) their health care proxy.  Risks were outlined as including, but not limited to, bleeding, infection, perforation, adverse medication reaction leading to cardiac or pulmonary decompensation, pancreatitis (if ERCP).  The limitation of incomplete mucosal visualization was also discussed.  No guarantees or warranties were given.  The patient is appropriate for an endoscopic procedure in the ambulatory setting.   - Amada Jupiter, MD

## 2024-01-29 NOTE — Patient Instructions (Signed)
 Please read handouts provided. Continue present medications. Resume previous diet. Minimize NSAID use.USTED TUVO UN PROCEDIMIENTO ENDOSCPICO HOY EN EL [facility]:   Lea el informe del procedimiento que se le entreg para cualquier pregunta especfica sobre lo que se Dentist.  Si el informe del examen no responde a sus preguntas, por favor llame a su gastroenterlogo para aclararlo.  Si usted solicit que no se le den [employer] de lo que se Clinical cytogeneticist en su procedimiento al [employer] va a cuidar, entonces el informe del procedimiento se ha incluido en un sobre sellado para que usted lo revise despus cuando le sea ms conveniente.   LO QUE PUEDE ESPERAR: Algunas sensaciones de hinchazn en el abdomen.  Puede tener ms gases de lo normal.  El caminar puede ayudarle a eliminar el aire que se le puso en el tracto gastrointestinal durante el procedimiento y reducir la hinchazn.  Si le hicieron una endoscopia inferior (como una colonoscopia o una sigmoidoscopia flexible), podra notar manchas de sangre en las heces fecales o en el papel higinico.  Si se someti a una preparacin intestinal para su procedimiento, es posible que no tenga una evacuacin intestinal normal durante [employer].   Tenga en cuenta:  Es posible que note un poco de irritacin y congestin en la nariz o algn drenaje.  Esto es debido al oxgeno [employer] durante su procedimiento.  No hay que preocuparse y esto debe desaparecer ms o Regulatory affairs officer.    Despus de la endoscopia superior (EGD)  Vmitos de Retail buyer o material como caf molido   Dolor en el pecho o dolor debajo de los omplatos que antes no tena   Dolor o dificultad persistente para tragar  Falta de aire que antes no tena   Fiebre de 100F o ms  Heces fecales negras y pegajosas   Para asuntos urgentes o de Associate Professor, puede comunicarse con un gastroenterlogo a cualquier hora llamando al [phone].  DIETA:   Recomendamos una comida pequea al principio, pero luego puede continuar con su dieta normal.  Tome muchos lquidos, Tax adviser las bebidas alcohlicas durante 24 horas.    ACTIVIDAD:  Debe planear tomarse las cosas con calma por el resto del da y no debe CONDUCIR ni usar maquinaria pesada Patent examiner (debido a los medicamentos de sedacin utilizados durante el examen).     SEGUIMIENTO: Nuestro personal llamar al nmero que aparece en su historial al siguiente da hbil de su procedimiento para ver cmo se siente y para responder cualquier pregunta o inquietud que pueda tener con respecto a la informacin que se le dio despus del procedimiento. Si no podemos contactarle, le dejaremos un mensaje.  Sin embargo, si se siente bien y no tiene English as a second language teacher, no es necesario que nos devuelva la llamada.  Asumiremos que ha regresado a sus actividades diarias normales sin incidentes. Si se le tomaron algunas biopsias, le contactaremos por telfono o por carta en las prximas 3 semanas.  Si no ha sabido [employer] biopsias en el transcurso de 3 semanas, por favor llmenos al [phone].   FIRMAS/CONFIDENCIALIDAD: Usted y/o el acompaante que le cuide han firmado documentos que se ingresarn en su historial mdico electrnico.  Estas firmas atestiguan el hecho de que la informacin anterior    YOU HAD AN ENDOSCOPIC PROCEDURE TODAY AT THE [facility]:   Refer to the procedure report that was given to you for any specific questions about what was  found during the examination.  If the procedure report does not answer your questions, please call your gastroenterologist to clarify.  If you requested that your care partner not be given the details of your procedure findings, then the procedure report has been included in a sealed envelope for you to review at your convenience later.  YOU SHOULD EXPECT: Some feelings of bloating in the abdomen. Passage of more gas than usual.  Walking  can help get rid of the air that was put into your GI tract during the procedure and reduce the bloating. If you had a lower endoscopy (such as a colonoscopy or flexible sigmoidoscopy) you may notice spotting of blood in your stool or on the toilet paper. If you underwent a bowel prep for your procedure, you may not have a normal bowel movement for a few days.  Please Note:  You might notice some irritation and congestion in your nose or some drainage.  This is from the oxygen used during your procedure.  There is no need for concern and it should clear up in a day or so.  SYMPTOMS TO REPORT IMMEDIATELY:  Following upper endoscopy (EGD)  Vomiting of blood or coffee ground material  New chest pain or pain under the shoulder blades  Painful or persistently difficult swallowing  New shortness of breath  Fever of 100F or higher  Black, tarry-looking stools  For urgent or emergent issues, a gastroenterologist can be reached at any hour by calling [phone]. Do not use MyChart messaging for urgent concerns.    DIET:  We do recommend a small meal at first, but then you may proceed to your regular diet.  Drink plenty of fluids but you should avoid alcoholic beverages for 24 hours.  ACTIVITY:  You should plan to take it easy for the rest of today and you should NOT DRIVE or use heavy machinery until tomorrow (because of the sedation medicines used during the test).    FOLLOW UP: Our staff will call the number listed on your records the next business day following your procedure.  We will call around 7:15- 8:00 am to check on you and address any questions or concerns that you may have regarding the information given to you following your procedure. If we do not reach you, we will leave a message.     If any biopsies were taken you will be contacted by phone or by letter within the next 1-3 weeks.  Please call us at [phone] if you have not heard about the biopsies in 3 weeks.     SIGNATURES/CONFIDENTIALITY: You and/or your care partner have signed paperwork which will be entered into your electronic medical record.  These signatures attest to the fact that that the information above on your After Visit Summary has been reviewed and is understood.  Full responsibility of the confidentiality of this discharge information lies with you and/or your care-partner.

## 2024-01-29 NOTE — Progress Notes (Signed)
 Interpreter used today at the [facility] for this pt.  Interpreter's name is-Maritza

## 2024-01-30 ENCOUNTER — Telehealth: Payer: Self-pay

## 2024-01-30 NOTE — Telephone Encounter (Signed)
  Follow up Call-     01/29/2024    9:04 AM  Call back number  Post procedure Call Back phone  # [phone]  Permission to leave phone message Yes     Patient questions:  Do you have a fever, pain , or abdominal swelling? No. Pain Score  0 *  Have you tolerated food without any problems? Yes.    Have you been able to return to your normal activities? Yes.    Do you have any questions about your discharge instructions: Diet   No. Medications  No. Follow up visit  No.  Do you have questions or concerns about your Care? No.  Actions: * If pain score is 4 or above: No action needed, pain <4.

## 2024-01-31 ENCOUNTER — Telehealth: Payer: Self-pay

## 2024-01-31 NOTE — Telephone Encounter (Signed)
 Patient called into the office today to discuss surgical referral. I advised that referral has not been placed yet, she will be notified once it has.

## 2024-02-12 ENCOUNTER — Other Ambulatory Visit: Payer: Self-pay

## 2024-02-12 ENCOUNTER — Other Ambulatory Visit: Payer: Self-pay | Admitting: Surgery

## 2024-02-12 MED ORDER — DICYCLOMINE HCL 20 MG PO TABS: 20.0000 mg | ORAL_TABLET | Freq: Three times a day (TID) | ORAL | 0 refills | Status: AC | PRN

## 2024-02-22 ENCOUNTER — Other Ambulatory Visit: Payer: Self-pay

## 2024-02-22 ENCOUNTER — Encounter (HOSPITAL_COMMUNITY): Payer: Self-pay | Admitting: Surgery

## 2024-02-22 NOTE — Progress Notes (Signed)
 Patient is Spanish speaking.  Used [employer] ID # [id number] for information and instructions for DOS.  PCP - none Cardiologist - none  Chest x-ray - n/a EKG - n/a Stress Test - n/a ECHO - n/a Cardiac Cath - n/a  ICD Pacemaker/Loop - n/a  Sleep Study -  n/a  Diabetes - n/a  Aspirin and Blood Thinner Instructions:  n/a  ERAS - clear liquids til 6 AM DOS.  Anesthesia review: no  STOP now taking any Aspirin (unless otherwise instructed by your surgeon), Aleve, Naproxen, Ibuprofen, Motrin, Advil, Goody's, BC's, all herbal medications, fish oil, and all vitamins.   Coronavirus Screening Do you have any of the following symptoms:  Cough yes/no: No Fever (>100.39F)  yes/no: No Runny nose yes/no: No Sore throat yes/no: No Difficulty breathing/shortness of breath  yes/no: No  Have you traveled in the last 14 days and where? yes/no: No  Patient verbalized understanding of instructions that were given to them via phone via [place] Interpreter.

## 2024-02-25 NOTE — H&P (Signed)
 REFERRING PHYSICIAN: Quentin Mulling PROVIDER: Wayne Both, MD MRN: [id number] DOB: Feb 09, 1996 DATE OF ENCOUNTER: 02/12/2024 Subjective   Chief Complaint: New Consultation (POSS GALLBLADDER SURGERY)  History of Present Illness: Diane Carroll is a 28 y.o. female who is seen today as an office consultation for evaluation of New Consultation (POSS GALLBLADDER SURGERY)  This is a 28 year old female referred here for evaluation of symptomatic gallstones. For several years now she has been having epigastric and right upper quadrant abdominal pain. She has had no nausea or vomiting. The pain can be sharp at times and is worse with fatty meals. She saw gastroenterology who did an upper endoscopy as well as ordering an ultrasound. The upper endoscopy was normal. The ultrasound showed multiple gallstones but a normal bile duct. Liver function tests are normal. She denies jaundice. Her mother has had a cholecystectomy for symptomatic gallstones in the past. She has had prior C-section and has no cardiopulmonary issues. She is otherwise healthy without complaints  Review of Systems: A complete review of systems was obtained from the patient. I have reviewed this information and discussed as appropriate with the patient. See HPI as well for other ROS.  ROS   Medical History: History reviewed. No pertinent past medical history.  There is no problem list on file for this patient.  History reviewed. No pertinent surgical history.   No Known Allergies  Current Outpatient Medications on File Prior to Visit  Medication Sig Dispense Refill  dicyclomine (BENTYL) 20 mg tablet Take 20 mg by mouth  ibuprofen (MOTRIN) 600 MG tablet Take 600 mg by mouth every 6 (six) hours  pantoprazole (PROTONIX) 40 MG DR tablet Take 40 mg by mouth once daily   No current facility-administered medications on file prior to visit.   Family History  Problem Relation Age of Onset  Diabetes Mother  Diabetes  Father    Social History   Tobacco Use  Smoking Status Never  Smokeless Tobacco Never    Social History   Socioeconomic History  Marital status: Single  Tobacco Use  Smoking status: Never  Smokeless tobacco: Never  Vaping Use  Vaping status: Never Used  Substance and Sexual Activity  Alcohol use: Never  Drug use: Never   Objective:   Vitals:  02/12/24 1541 02/12/24 1542  BP: 99/66  Pulse: 80  Temp: 36.8 C (98.3 F)  SpO2: 98%  Weight: 70 kg (154 lb 6.4 oz)  Height: 152 cm (4' 11.84")  PainSc: 8  PainLoc: Abdomen   Body mass index is 30.31 kg/m.  Physical Exam   She appears well on exam today  Her abdomen is soft and nontender. There is no hepatomegaly and no masses  Labs, Imaging and Diagnostic Testing: I have reviewed her endoscopy, notes from the gastroenterologist, ultrasound of the right upper quadrant, and her laboratory data  Assessment and Plan:   Diagnoses and all orders for this visit:  Symptomatic cholelithiasis   Through interpreter I next discussed the diagnosis which she is well aware of given her mother's prior history. She is already interested proceed with a laparoscopic cholecystectomy given the amount of symptoms she has been having and the frequency. Through the interpreter I explained the surgical procedure in detail. We discussed the risks which includes but is not limited to bleeding, infection, the need to convert to an open procedure, bile duct injury, bile leak, the injury to other structures, cardiopulmonary issues with anesthesia, blood clots, postoperative recovery, etc. She understands and wishes to proceed with surgery  which will be scheduled

## 2024-02-26 ENCOUNTER — Encounter (HOSPITAL_COMMUNITY)
Admission: RE | Disposition: A | Discharge status: Home / Self Care | Payer: Self-pay | Source: Home / Self Care | Attending: Surgery

## 2024-02-26 ENCOUNTER — Ambulatory Visit (HOSPITAL_COMMUNITY): Admitting: Anesthesiology

## 2024-02-26 ENCOUNTER — Other Ambulatory Visit: Payer: Self-pay

## 2024-02-26 ENCOUNTER — Encounter (HOSPITAL_COMMUNITY): Payer: Self-pay | Admitting: Surgery

## 2024-02-26 ENCOUNTER — Ambulatory Visit (HOSPITAL_BASED_OUTPATIENT_CLINIC_OR_DEPARTMENT_OTHER): Admitting: Anesthesiology

## 2024-02-26 ENCOUNTER — Ambulatory Visit (HOSPITAL_COMMUNITY)
Admission: RE | Admit: 2024-02-26 | Discharge: 2024-02-26 | Disposition: A | Discharge status: Home / Self Care | Attending: Surgery | Admitting: Surgery

## 2024-02-26 DIAGNOSIS — K219 Gastro-esophageal reflux disease without esophagitis: Secondary | ICD-10-CM | POA: Diagnosis not present

## 2024-02-26 DIAGNOSIS — K8020 Calculus of gallbladder without cholecystitis without obstruction: Secondary | ICD-10-CM | POA: Diagnosis present

## 2024-02-26 DIAGNOSIS — K8010 Calculus of gallbladder with chronic cholecystitis without obstruction: Secondary | ICD-10-CM

## 2024-02-26 HISTORY — PX: CHOLECYSTECTOMY: SHX55

## 2024-02-26 HISTORY — DX: Gastro-esophageal reflux disease without esophagitis: K21.9

## 2024-02-26 LAB — POCT PREGNANCY, URINE: Preg Test, Ur: NEGATIVE

## 2024-02-26 LAB — CBC
HCT: 36.5 % (ref 36.0–46.0)
Hemoglobin: 11.8 g/dL — ABNORMAL LOW (ref 12.0–15.0)
MCH: 28.2 pg (ref 26.0–34.0)
MCHC: 32.3 g/dL (ref 30.0–36.0)
MCV: 87.3 fL (ref 80.0–100.0)
Platelets: 265 10*3/uL (ref 150–400)
RBC: 4.18 MIL/uL (ref 3.87–5.11)
RDW: 12.9 % (ref 11.5–15.5)
WBC: 5 10*3/uL (ref 4.0–10.5)
nRBC: 0 % (ref 0.0–0.2)

## 2024-02-26 SURGERY — LAPAROSCOPIC CHOLECYSTECTOMY
Anesthesia: General

## 2024-02-26 MED ORDER — DEXMEDETOMIDINE HCL IN NACL 80 MCG/20ML IV SOLN
INTRAVENOUS | Status: DC | PRN
  Administered 2024-02-26: 12 ug via INTRAVENOUS
  Administered 2024-02-26: 8 ug via INTRAVENOUS

## 2024-02-26 MED ORDER — ACETAMINOPHEN 500 MG PO TABS: 1000.0000 mg | ORAL_TABLET | ORAL | Status: AC

## 2024-02-26 MED ORDER — ROCURONIUM BROMIDE 10 MG/ML (PF) SYRINGE
PREFILLED_SYRINGE | INTRAVENOUS | Status: AC
  Filled 2024-02-26: qty 10

## 2024-02-26 MED ORDER — DEXAMETHASONE SODIUM PHOSPHATE 10 MG/ML IJ SOLN
INTRAMUSCULAR | Status: DC | PRN
  Administered 2024-02-26: 10 mg via INTRAVENOUS

## 2024-02-26 MED ORDER — FENTANYL CITRATE (PF) 100 MCG/2ML IJ SOLN
INTRAMUSCULAR | Status: DC
Start: 2024-02-26 — End: 2024-02-26
  Filled 2024-02-26: qty 2

## 2024-02-26 MED ORDER — PROPOFOL 10 MG/ML IV BOLUS
INTRAVENOUS | Status: DC | PRN
  Administered 2024-02-26: 150 mg via INTRAVENOUS

## 2024-02-26 MED ORDER — OXYCODONE HCL 5 MG PO TABS: 5.0000 mg | ORAL_TABLET | Freq: Four times a day (QID) | ORAL | 0 refills | Status: AC | PRN

## 2024-02-26 MED ORDER — DEXMEDETOMIDINE HCL IN NACL 80 MCG/20ML IV SOLN
INTRAVENOUS | Status: AC
  Filled 2024-02-26: qty 20

## 2024-02-26 MED ORDER — BUPIVACAINE-EPINEPHRINE (PF) 0.25% -1:200000 IJ SOLN
INTRAMUSCULAR | Status: AC
  Filled 2024-02-26: qty 30

## 2024-02-26 MED ORDER — SODIUM CHLORIDE 0.9 % IR SOLN
Status: DC | PRN
  Administered 2024-02-26: 1

## 2024-02-26 MED ORDER — ACETAMINOPHEN 500 MG PO TABS
ORAL_TABLET | ORAL | Status: AC
  Administered 2024-02-26: 1000 mg via ORAL
  Filled 2024-02-26: qty 2

## 2024-02-26 MED ORDER — FENTANYL CITRATE (PF) 250 MCG/5ML IJ SOLN
INTRAMUSCULAR | Status: AC
  Filled 2024-02-26: qty 5

## 2024-02-26 MED ORDER — CEFAZOLIN SODIUM-DEXTROSE 2-4 GM/100ML-% IV SOLN
INTRAVENOUS | Status: AC
Start: 2024-02-26 — End: 2024-02-26
  Filled 2024-02-26: qty 100

## 2024-02-26 MED ORDER — CHLORHEXIDINE GLUCONATE CLOTH 2 % EX PADS
6.0000 | MEDICATED_PAD | Freq: Once | CUTANEOUS | Status: DC
Start: 2024-02-26 — End: 2024-02-26

## 2024-02-26 MED ORDER — LIDOCAINE 2% (20 MG/ML) 5 ML SYRINGE
INTRAMUSCULAR | Status: DC | PRN
  Administered 2024-02-26: 60 mg via INTRAVENOUS

## 2024-02-26 MED ORDER — ROCURONIUM BROMIDE 10 MG/ML (PF) SYRINGE
PREFILLED_SYRINGE | INTRAVENOUS | Status: DC | PRN
  Administered 2024-02-26: 50 mg via INTRAVENOUS

## 2024-02-26 MED ORDER — SUGAMMADEX SODIUM 200 MG/2ML IV SOLN
INTRAVENOUS | Status: DC | PRN
  Administered 2024-02-26: 100 mg via INTRAVENOUS
  Administered 2024-02-26: 200 mg via INTRAVENOUS

## 2024-02-26 MED ORDER — AMISULPRIDE (ANTIEMETIC) 5 MG/2ML IV SOLN: 10.0000 mg | Freq: Once | INTRAVENOUS | Status: DC | PRN

## 2024-02-26 MED ORDER — BUPIVACAINE-EPINEPHRINE 0.25% -1:200000 IJ SOLN
INTRAMUSCULAR | Status: DC | PRN
  Administered 2024-02-26: 20 mL

## 2024-02-26 MED ORDER — MIDAZOLAM HCL 2 MG/2ML IJ SOLN
INTRAMUSCULAR | Status: DC | PRN
  Administered 2024-02-26: 2 mg via INTRAVENOUS

## 2024-02-26 MED ORDER — PROPOFOL 10 MG/ML IV BOLUS
INTRAVENOUS | Status: AC
  Filled 2024-02-26: qty 20

## 2024-02-26 MED ORDER — CEFAZOLIN SODIUM-DEXTROSE 2-4 GM/100ML-% IV SOLN
2.0000 g | INTRAVENOUS | Status: AC
  Administered 2024-02-26: 2 g via INTRAVENOUS

## 2024-02-26 MED ORDER — MIDAZOLAM HCL 2 MG/2ML IJ SOLN
INTRAMUSCULAR | Status: AC
Start: 2024-02-26 — End: ?
  Filled 2024-02-26: qty 2

## 2024-02-26 MED ORDER — ORAL CARE MOUTH RINSE: 15.0000 mL | Freq: Once | OROMUCOSAL | Status: AC

## 2024-02-26 MED ORDER — FENTANYL CITRATE (PF) 100 MCG/2ML IJ SOLN
25.0000 ug | INTRAMUSCULAR | Status: DC | PRN
  Administered 2024-02-26 (×2): 25 ug via INTRAVENOUS

## 2024-02-26 MED ORDER — CHLORHEXIDINE GLUCONATE CLOTH 2 % EX PADS: 6.0000 | MEDICATED_PAD | Freq: Once | CUTANEOUS | Status: DC

## 2024-02-26 MED ORDER — OXYCODONE HCL 5 MG/5ML PO SOLN
5.0000 mg | Freq: Once | ORAL | Status: AC | PRN
  Administered 2024-02-26: 5 mg via ORAL

## 2024-02-26 MED ORDER — LACTATED RINGERS IV SOLN: INTRAVENOUS | Status: DC

## 2024-02-26 MED ORDER — OXYCODONE HCL 5 MG/5ML PO SOLN
ORAL | Status: AC
  Filled 2024-02-26: qty 5

## 2024-02-26 MED ORDER — ONDANSETRON HCL 4 MG/2ML IJ SOLN
INTRAMUSCULAR | Status: DC | PRN
  Administered 2024-02-26: 4 mg via INTRAVENOUS

## 2024-02-26 MED ORDER — DEXAMETHASONE SODIUM PHOSPHATE 10 MG/ML IJ SOLN
INTRAMUSCULAR | Status: AC
  Filled 2024-02-26: qty 1

## 2024-02-26 MED ORDER — CHLORHEXIDINE GLUCONATE 0.12 % MT SOLN: 15.0000 mL | Freq: Once | OROMUCOSAL | Status: AC

## 2024-02-26 MED ORDER — ENSURE PRE-SURGERY PO LIQD
296.0000 mL | Freq: Once | ORAL | Status: DC
Start: 2024-02-27 — End: 2024-02-26

## 2024-02-26 MED ORDER — ONDANSETRON HCL 4 MG/2ML IJ SOLN
INTRAMUSCULAR | Status: AC
  Filled 2024-02-26: qty 2

## 2024-02-26 MED ORDER — FENTANYL CITRATE (PF) 250 MCG/5ML IJ SOLN
INTRAMUSCULAR | Status: DC | PRN
  Administered 2024-02-26: 50 ug via INTRAVENOUS
  Administered 2024-02-26 (×2): 100 ug via INTRAVENOUS

## 2024-02-26 MED ORDER — LIDOCAINE 2% (20 MG/ML) 5 ML SYRINGE
INTRAMUSCULAR | Status: AC
  Filled 2024-02-26: qty 5

## 2024-02-26 MED ORDER — CHLORHEXIDINE GLUCONATE 0.12 % MT SOLN
OROMUCOSAL | Status: AC
  Administered 2024-02-26: 15 mL via OROMUCOSAL
  Filled 2024-02-26: qty 15

## 2024-02-26 MED ORDER — OXYCODONE HCL 5 MG PO TABS: 5.0000 mg | ORAL_TABLET | Freq: Once | ORAL | Status: AC | PRN

## 2024-02-26 SURGICAL SUPPLY — 32 items
APPLIER CLIP 5 13 M/L LIGAMAX5 (MISCELLANEOUS) ×1 IMPLANT
BAG COUNTER SPONGE SURGICOUNT (BAG) ×1 IMPLANT
CANISTER SUCT 3000ML PPV (MISCELLANEOUS) ×1 IMPLANT
CHLORAPREP W/TINT 26 (MISCELLANEOUS) ×1 IMPLANT
CLIP APPLIE 5 13 M/L LIGAMAX5 (MISCELLANEOUS) ×1 IMPLANT
COVER SURGICAL LIGHT HANDLE (MISCELLANEOUS) ×1 IMPLANT
DERMABOND ADVANCED .7 DNX12 (GAUZE/BANDAGES/DRESSINGS) ×1 IMPLANT
ELECT REM PT RETURN 9FT ADLT (ELECTROSURGICAL) ×1 IMPLANT
ELECTRODE REM PT RTRN 9FT ADLT (ELECTROSURGICAL) ×1 IMPLANT
GLOVE SURG SIGNA 7.5 PF LTX (GLOVE) ×1 IMPLANT
GOWN STRL REUS W/ TWL LRG LVL3 (GOWN DISPOSABLE) ×2 IMPLANT
GOWN STRL REUS W/ TWL XL LVL3 (GOWN DISPOSABLE) ×1 IMPLANT
IRRIG SUCT STRYKERFLOW 2 WTIP (MISCELLANEOUS) ×1 IMPLANT
IRRIGATION SUCT STRKRFLW 2 WTP (MISCELLANEOUS) ×1 IMPLANT
KIT BASIN OR (CUSTOM PROCEDURE TRAY) ×1 IMPLANT
KIT TURNOVER KIT B (KITS) ×1 IMPLANT
NS IRRIG 1000ML POUR BTL (IV SOLUTION) ×1 IMPLANT
PAD ARMBOARD POSITIONER FOAM (MISCELLANEOUS) ×1 IMPLANT
SCISSORS LAP 5X35 DISP (ENDOMECHANICALS) ×1 IMPLANT
SET TUBE SMOKE EVAC HIGH FLOW (TUBING) ×1 IMPLANT
SLEEVE Z-THREAD 5X100MM (TROCAR) ×2 IMPLANT
SPECIMEN JAR SMALL (MISCELLANEOUS) ×1 IMPLANT
SUT MNCRL AB 4-0 PS2 18 (SUTURE) ×1 IMPLANT
SYS BAG RETRIEVAL 10MM (BASKET) ×1 IMPLANT
SYSTEM BAG RETRIEVAL 10MM (BASKET) ×1 IMPLANT
TOWEL GREEN STERILE (TOWEL DISPOSABLE) ×1 IMPLANT
TOWEL GREEN STERILE FF (TOWEL DISPOSABLE) ×1 IMPLANT
TRAY LAPAROSCOPIC MC (CUSTOM PROCEDURE TRAY) ×1 IMPLANT
TROCAR BALLN 12MMX100 BLUNT (TROCAR) ×1 IMPLANT
TROCAR Z-THREAD OPTICAL 5X100M (TROCAR) ×1 IMPLANT
WARMER LAPAROSCOPE (MISCELLANEOUS) ×1 IMPLANT
WATER STERILE IRR 1000ML POUR (IV SOLUTION) ×1 IMPLANT

## 2024-02-26 NOTE — Discharge Instructions (Signed)
 [facility] ______[facility] LAPAROSCOPIC SURGERY: POST OP INSTRUCTIONS Always review your discharge instruction sheet given to you by the facility where your surgery was performed. IF YOU HAVE DISABILITY OR FAMILY LEAVE FORMS, YOU MUST BRING THEM TO THE OFFICE FOR PROCESSING.   DO NOT GIVE THEM TO YOUR DOCTOR.  A prescription for pain medication may be given to you upon discharge.  Take your pain medication as prescribed, if needed.  If narcotic pain medicine is not needed, then you may take acetaminophen (Tylenol) or ibuprofen (Advil) as needed. Take your usually prescribed medications unless otherwise directed. If you need a refill on your pain medication, please contact your pharmacy.  They will contact our office to request authorization. Prescriptions will not be filled after 5pm or on week-ends. You should follow a light diet the first few days after arrival home, such as soup and crackers, etc.  Be sure to include lots of fluids daily. Most patients will experience some swelling and bruising in the area of the incisions.  Ice packs will help.  Swelling and bruising can take several days to resolve.  It is common to experience some constipation if taking pain medication after surgery.  Increasing fluid intake and taking a stool softener (such as Colace) will usually help or prevent this problem from occurring.  A mild laxative (Milk of Magnesia or Miralax) should be taken according to package instructions if there are no bowel movements after 48 hours. Unless discharge instructions indicate otherwise, you may remove your bandages 24-48 hours after surgery, and you may shower at that time.  You may have steri-strips (small skin tapes) in place directly over the incision.  These strips should be left on the skin for 7-10 days.  If your surgeon used skin glue on the incision, you may shower in 24 hours.  The glue will flake off over the next 2-3 weeks.  Any sutures or staples will be  removed at the office during your follow-up visit. ACTIVITIES:  You may resume regular (light) daily activities beginning the next day--such as daily self-care, walking, climbing stairs--gradually increasing activities as tolerated.  You may have sexual intercourse when it is comfortable.  Refrain from any heavy lifting or straining until approved by your doctor. You may drive when you are no longer taking prescription pain medication, you can comfortably wear a seatbelt, and you can safely maneuver your car and apply brakes. RETURN TO WORK:  __________________________________________________________ Diane Carroll should see your doctor in the office for a follow-up appointment approximately 2-3 weeks after your surgery.  Make sure that you call for this appointment within a day or two after you arrive home to insure a convenient appointment time. OTHER INSTRUCTIONS: OK TO SHOWER STARTING TOMORROW ICE PACK, TYLENOL, AND IBUPROFEN ALSO FOR PAIN NO LIFTING MORE THAN 15 POUNDS FOR 4 WEEKS __________________________________________________________________________________________________________________________ __________________________________________________________________________________________________________________________ WHEN TO CALL YOUR DOCTOR: Fever over 101.0 Inability to urinate Continued bleeding from incision. Increased pain, redness, or drainage from the incision. Increasing abdominal pain  The clinic staff is available to answer your questions during regular business hours.  Please don't hesitate to call and ask to speak to one of the nurses for clinical concerns.  If you have a medical emergency, go to the nearest emergency room or call 911.  A surgeon from [facility] is always on call at the hospital.     [address], [address], [place], [place]  [address] ? [address] [address], [place], [place]   [address] [phone])  [phone] ? [phone] ? FAX [phone] Web site:  [email or web address]

## 2024-02-26 NOTE — Interval H&P Note (Signed)
 History and Physical Interval Note:no change in H and P  02/26/2024 8:11 AM  Diane Carroll  has presented today for surgery, with the diagnosis of SYMPTOMATIC GALLSTONES.  The various methods of treatment have been discussed with the patient and family. After consideration of risks, benefits and other options for treatment, the patient has consented to  Procedure(s): LAPAROSCOPIC CHOLECYSTECTOMY (N/A) as a surgical intervention.  The patient's history has been reviewed, patient examined, no change in status, stable for surgery.  I have reviewed the patient's chart and labs.  Questions were answered to the patient's satisfaction.     Abigail Miyamoto

## 2024-02-26 NOTE — Anesthesia Preprocedure Evaluation (Addendum)
 Anesthesia Evaluation  Patient identified by MRN, date of birth, ID band Patient awake    Reviewed: Allergy & Precautions, NPO status , Patient's Chart, lab work & pertinent test results  Airway Mallampati: II  TM Distance: >3 FB Neck ROM: Full    Dental no notable dental hx. (+) Teeth Intact, Dental Advisory Given   Pulmonary neg pulmonary ROS   Pulmonary exam normal breath sounds clear to auscultation       Cardiovascular negative cardio ROS Normal cardiovascular exam Rhythm:Regular Rate:Normal     Neuro/Psych negative neurological ROS  negative psych ROS   GI/Hepatic Neg liver ROS,GERD  Medicated,,  Endo/Other  negative endocrine ROS    Renal/GU negative Renal ROS  negative genitourinary   Musculoskeletal negative musculoskeletal ROS (+)    Abdominal   Peds  Hematology negative hematology ROS (+)   Anesthesia Other Findings   Reproductive/Obstetrics                             Anesthesia Physical Anesthesia Plan  ASA: 2  Anesthesia Plan: General   Post-op Pain Management: Tylenol PO (pre-op)*   Induction: Intravenous  PONV Risk Score and Plan: 3 and Midazolam, Dexamethasone and Ondansetron  Airway Management Planned: Oral ETT  Additional Equipment:   Intra-op Plan:   Post-operative Plan: Extubation in OR  Informed Consent: I have reviewed the patients History and Physical, chart, labs and discussed the procedure including the risks, benefits and alternatives for the proposed anesthesia with the patient or authorized representative who has indicated his/her understanding and acceptance.     Dental advisory given  Plan Discussed with: CRNA  Anesthesia Plan Comments:        Anesthesia Quick Evaluation

## 2024-02-26 NOTE — Op Note (Signed)
 Laparoscopic Cholecystectomy Procedure Note  Indications: This patient presents with symptomatic gallbladder disease and will undergo laparoscopic cholecystectomy.  Pre-operative Diagnosis: symptomatic cholelithiasis  Post-operative Diagnosis: same  Surgeon: Abigail Miyamoto   Assistants: Saunders Glance, PA  Anesthesia: General endotracheal anesthesia  ASA Class: 1  Procedure Details  The patient was seen again in the Holding Room. The risks, benefits, complications, treatment options, and expected outcomes were discussed with the patient. The possibilities of reaction to medication, pulmonary aspiration, perforation of viscus, bleeding, recurrent infection, finding a normal gallbladder, the need for additional procedures, failure to diagnose a condition, the possible need to convert to an open procedure, and creating a complication requiring transfusion or operation were discussed with the patient. The likelihood of improving the patient's symptoms with return to their baseline status is good.  The patient and/or family concurred with the proposed plan, giving informed consent. The site of surgery properly noted. The patient was taken to Operating Room, identified as Loma Messing and the procedure verified as Laparoscopic Cholecystectomy with Intraoperative Cholangiogram. A Time Out was held and the above information confirmed.  Prior to the induction of general anesthesia, antibiotic prophylaxis was administered. General endotracheal anesthesia was then administered and tolerated well. After the induction, the abdomen was prepped with Chloraprep and draped in sterile fashion. The patient was positioned in the supine position.  Local anesthetic agent was injected into the skin near the umbilicus and an incision made. We dissected down to the abdominal fascia with blunt dissection.  The fascia was incised vertically and we entered the peritoneal cavity bluntly.  A pursestring suture of  0-Vicryl was placed around the fascial opening.  The Hasson cannula was inserted and secured with the stay suture.  Pneumoperitoneum was then created with CO2 and tolerated well without any adverse changes in the patient's vital signs. A 5-mm port was placed in the subxiphoid position.  Two 5-mm ports were placed in the right upper quadrant. All skin incisions were infiltrated with a local anesthetic agent before making the incision and placing the trocars.   We positioned the patient in reverse Trendelenburg, tilted slightly to the patient's left.  The gallbladder was identified, the fundus grasped and retracted cephalad. Adhesions were lysed bluntly and with the electrocautery where indicated, taking care not to injure any adjacent organs or viscus. The infundibulum was grasped and retracted laterally, exposing the peritoneum overlying the triangle of Calot. This was then divided and exposed in a blunt fashion. The cystic duct was clearly identified and bluntly dissected circumferentially. A critical view of the cystic duct and cystic artery was obtained.  The cystic duct was then ligated with clips and divided. The cystic artery was, dissected free, ligated with clips and divided as well. A posterior branch of the cystic artery was also placed.  The gallbladder was dissected from the liver bed in retrograde fashion with the electrocautery. The gallbladder was removed and placed in an Endocatch sac. The liver bed was irrigated and inspected. Hemostasis was achieved with the electrocautery. Copious irrigation was utilized and was repeatedly aspirated until clear.  The gallbladder and Endocatch sac were then removed through the umbilical port site.  The pursestring suture was used to close the umbilical fascia.    We again inspected the right upper quadrant for hemostasis.  Pneumoperitoneum was released as we removed the trocars.  A second 0 vicryl was placed at the fascial incision. 4-0 Monocryl was used to  close the skin.   Skin glue was then applied.  The patient was then extubated and brought to the recovery room in stable condition. Instrument, sponge, and needle counts were correct at closure and at the conclusion of the case.   Findings: Chronic cholecystitis with Cholelithiasis  Estimated Blood Loss: Minimal         Drains: none         Specimens: Gallbladder           Complications: None; patient tolerated the procedure well.         Disposition: PACU - hemodynamically stable.         Condition: stable

## 2024-02-26 NOTE — Anesthesia Postprocedure Evaluation (Signed)
 Anesthesia Post Note  Patient: Takako Minckler  Procedure(s) Performed: LAPAROSCOPIC CHOLECYSTECTOMY     Patient location during evaluation: PACU Anesthesia Type: General Level of consciousness: awake and alert Pain management: pain level controlled Vital Signs Assessment: post-procedure vital signs reviewed and stable Respiratory status: spontaneous breathing, nonlabored ventilation, respiratory function stable and patient connected to nasal cannula oxygen Cardiovascular status: blood pressure returned to baseline and stable Postop Assessment: no apparent nausea or vomiting Anesthetic complications: no  No notable events documented.  Last Vitals:  Vitals:   02/26/24 1045 02/26/24 1100  BP: 101/73 90/69  Pulse: 84 65  Resp: 16 12  Temp:  (!) 36.4 C  SpO2: 98% 99%    Last Pain:  Vitals:   02/26/24 1100  PainSc: 4                  Zolton Dowson L Gay Moncivais

## 2024-02-26 NOTE — Anesthesia Procedure Notes (Signed)
 Procedure Name: Intubation Date/Time: 02/26/2024 9:04 AM  Performed by: Randon Goldsmith, CRNAPre-anesthesia Checklist: Patient identified, Patient being monitored, Timeout performed, Emergency Drugs available and Suction available Patient Re-evaluated:Patient Re-evaluated prior to induction Oxygen Delivery Method: Circle system utilized Preoxygenation: Pre-oxygenation with 100% oxygen Induction Type: IV induction Ventilation: Mask ventilation without difficulty Laryngoscope Size: Mac and 3 Grade View: Grade I Tube type: Oral Tube size: 7.0 mm Number of attempts: 1 Airway Equipment and Method: Stylet Placement Confirmation: ETT inserted through vocal cords under direct vision, positive ETCO2 and breath sounds checked- equal and bilateral Secured at: 19 cm Tube secured with: Tape Dental Injury: Teeth and Oropharynx as per pre-operative assessment

## 2024-02-26 NOTE — Transfer of Care (Signed)
 Immediate Anesthesia Transfer of Care Note  Patient: Diane Carroll  Procedure(s) Performed: LAPAROSCOPIC CHOLECYSTECTOMY  Patient Location: PACU  Anesthesia Type:General  Level of Consciousness: drowsy and patient cooperative  Airway & Oxygen Therapy: Patient Spontanous Breathing and Patient connected to nasal cannula oxygen  Post-op Assessment: Report given to RN, Post -op Vital signs reviewed and stable, and Patient moving all extremities X 4  Post vital signs: Reviewed and stable  Last Vitals:  Vitals Value Taken Time  BP 105/68 02/26/24 1000  Temp 35.8 C 02/26/24 1000  Pulse 76 02/26/24 1004  Resp 15 02/26/24 1004  SpO2 99 % 02/26/24 1004  Vitals shown include unfiled device data.  Last Pain: There were no vitals filed for this visit.       Complications: No notable events documented.

## 2024-02-27 ENCOUNTER — Encounter (HOSPITAL_COMMUNITY): Payer: Self-pay | Admitting: Surgery

## 2024-02-27 LAB — SURGICAL PATHOLOGY

## 2024-11-04 ENCOUNTER — Other Ambulatory Visit: Payer: Self-pay | Admitting: Nurse Practitioner

## 2024-11-04 DIAGNOSIS — Z36 Encounter for antenatal screening for chromosomal anomalies: Secondary | ICD-10-CM

## 2024-12-04 ENCOUNTER — Encounter: Payer: Self-pay | Admitting: *Deleted

## 2024-12-04 DIAGNOSIS — O99210 Obesity complicating pregnancy, unspecified trimester: Secondary | ICD-10-CM | POA: Insufficient documentation

## 2024-12-18 ENCOUNTER — Ambulatory Visit (HOSPITAL_BASED_OUTPATIENT_CLINIC_OR_DEPARTMENT_OTHER): Admitting: Obstetrics

## 2024-12-18 ENCOUNTER — Ambulatory Visit: Attending: Nurse Practitioner

## 2024-12-18 VITALS — BP 106/51 | HR 78 | Ht 61.0 in

## 2024-12-18 DIAGNOSIS — O283 Abnormal ultrasonic finding on antenatal screening of mother: Secondary | ICD-10-CM | POA: Diagnosis present

## 2024-12-18 DIAGNOSIS — O34219 Maternal care for unspecified type scar from previous cesarean delivery: Secondary | ICD-10-CM

## 2024-12-18 DIAGNOSIS — O99210 Obesity complicating pregnancy, unspecified trimester: Secondary | ICD-10-CM

## 2024-12-18 DIAGNOSIS — O99212 Obesity complicating pregnancy, second trimester: Secondary | ICD-10-CM | POA: Diagnosis not present

## 2024-12-18 DIAGNOSIS — Z3A16 16 weeks gestation of pregnancy: Secondary | ICD-10-CM

## 2024-12-18 DIAGNOSIS — Z3A19 19 weeks gestation of pregnancy: Secondary | ICD-10-CM | POA: Diagnosis present

## 2024-12-18 DIAGNOSIS — Z3689 Encounter for other specified antenatal screening: Secondary | ICD-10-CM

## 2024-12-18 DIAGNOSIS — Z36 Encounter for antenatal screening for chromosomal anomalies: Secondary | ICD-10-CM | POA: Insufficient documentation

## 2024-12-18 NOTE — Progress Notes (Signed)
 MFM Consult Note  Diane Carroll was seen today for a detailed fetal anatomy scan due to maternal obesity.  She denies any significant past medical history and denies any problems in her current pregnancy.    She had a cell free DNA test earlier in her pregnancy which indicated a low risk for trisomy 58, 81, and 13. A female fetus is predicted.   The patient reports that her [facility] of May 14, 2025 was based on a presumed LMP.  She reports that she had her Nexplanon removed in September 2025 and believes that she bled about 2 weeks following the removal of her Nexplanon.  Sonographic findings Single intrauterine pregnancy at 16w 3d. Fetal cardiac activity:  Observed and appears normal. Presentation: Breech. The views of the fetal anatomy were limited today due to her early gestational age. Based on the fetal biometry measurements obtained today, her EDC was changed to June 01, 2025, making her 16 weeks and 3 days pregnant today. Fetal biometry shows the estimated fetal weight of 0 lb 6 oz, 156 grams (42%). Amniotic fluid: Within normal limits.  MVP: 3.6 cm. Placenta: Posterior. Adnexa: No abnormality visualized. Cervical length: 4.1 cm.  The patient was informed that anomalies may be missed due to technical limitations. If the fetus is in a suboptimal position or maternal habitus is increased, visualization of the fetus in the maternal uterus may be impaired.  A follow-up exam was scheduled in 4 weeks to complete the views of the fetal anatomy and to confirm her dates.    All conversations were held with the patient today with the help of a Spanish interpreter.  The patient stated that all of her questions were answered.   A total of 30 minutes was spent counseling, reviewing her chart, and coordinating the care for this patient.  Greater than 50% of the time was spent in direct face-to-face contact.
